# Patient Record
Sex: Male | Born: 1955 | Race: White | Hispanic: No | Marital: Married | Smoking: Former smoker
Health system: Southern US, Community
[De-identification: ages and names within clinical notes are randomized; demographics above are authoritative.]

## PROBLEM LIST (undated history)

## (undated) DIAGNOSIS — I10 Essential (primary) hypertension: Secondary | ICD-10-CM

## (undated) DIAGNOSIS — E785 Hyperlipidemia, unspecified: Secondary | ICD-10-CM

## (undated) DIAGNOSIS — I2510 Atherosclerotic heart disease of native coronary artery without angina pectoris: Secondary | ICD-10-CM

## (undated) DIAGNOSIS — I219 Acute myocardial infarction, unspecified: Secondary | ICD-10-CM

## (undated) DIAGNOSIS — E119 Type 2 diabetes mellitus without complications: Secondary | ICD-10-CM

## (undated) DIAGNOSIS — G4733 Obstructive sleep apnea (adult) (pediatric): Secondary | ICD-10-CM

## (undated) DIAGNOSIS — I5189 Other ill-defined heart diseases: Secondary | ICD-10-CM

## (undated) DIAGNOSIS — Z9889 Other specified postprocedural states: Secondary | ICD-10-CM

## (undated) DIAGNOSIS — Z8679 Personal history of other diseases of the circulatory system: Secondary | ICD-10-CM

## (undated) HISTORY — DX: Obstructive sleep apnea (adult) (pediatric): G47.33

## (undated) HISTORY — DX: Nausea with vomiting, unspecified: Z98.890

## (undated) HISTORY — DX: Atherosclerotic heart disease of native coronary artery without angina pectoris: I25.10

## (undated) HISTORY — DX: Other ill-defined heart diseases: I51.89

## (undated) HISTORY — DX: Hyperlipidemia, unspecified: E78.5

## (undated) HISTORY — DX: Personal history of other diseases of the circulatory system: Z86.79

## (undated) HISTORY — PX: TONSILLECTOMY: SUR1361

## (undated) HISTORY — PX: CARDIAC CATHETERIZATION: SHX172

## (undated) HISTORY — PX: CORONARY ARTERY BYPASS GRAFT: SHX141

## (undated) HISTORY — DX: Type 2 diabetes mellitus without complications: E11.9

---

## 2002-07-28 HISTORY — PX: ROTATOR CUFF REPAIR: SHX139

## 2004-03-01 HISTORY — PX: TRIGGER FINGER RELEASE: SHX641

## 2004-05-27 ENCOUNTER — Encounter: Payer: Self-pay | Admitting: Unknown Physician Specialty

## 2004-05-28 ENCOUNTER — Encounter: Payer: Self-pay | Admitting: Unknown Physician Specialty

## 2005-06-27 ENCOUNTER — Ambulatory Visit: Payer: Self-pay | Admitting: Unknown Physician Specialty

## 2006-11-18 ENCOUNTER — Ambulatory Visit: Payer: Self-pay | Admitting: Internal Medicine

## 2007-04-16 ENCOUNTER — Ambulatory Visit: Payer: Self-pay | Admitting: Emergency Medicine

## 2008-11-07 IMAGING — US SCREENING ULTRASOUND OF ABDOMINAL AORTA
1 series · 10 of 10 positions shown · non-contrast
Comparison: none

REASON FOR EXAM: pulsating mass in abd area eval for AAA
COMMENTS:

[Series 1: screening ultrasound of abdominal aorta · 10 of 10 slices shown]
[im 1/10]
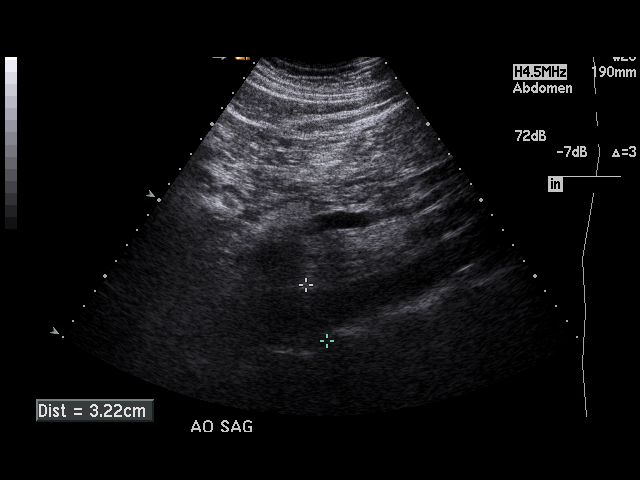
[im 2/10]
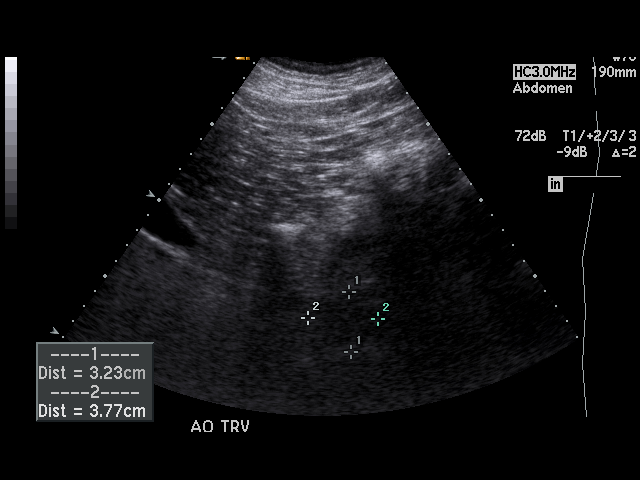
[im 3/10]
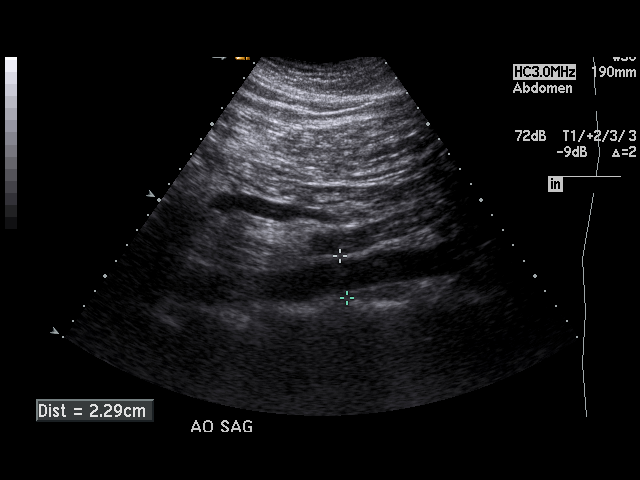
[im 4/10]
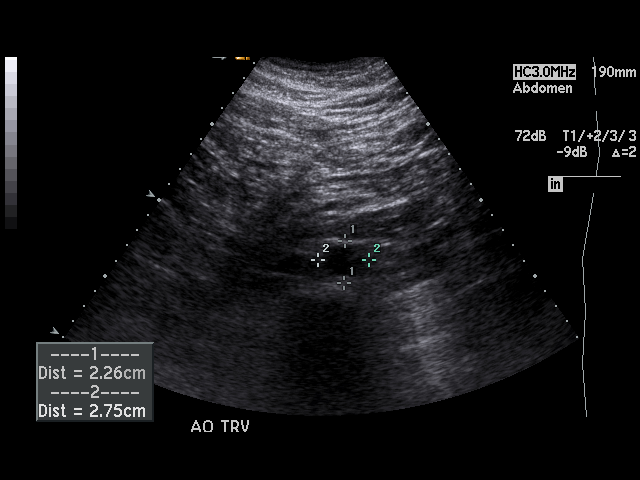
[im 5/10]
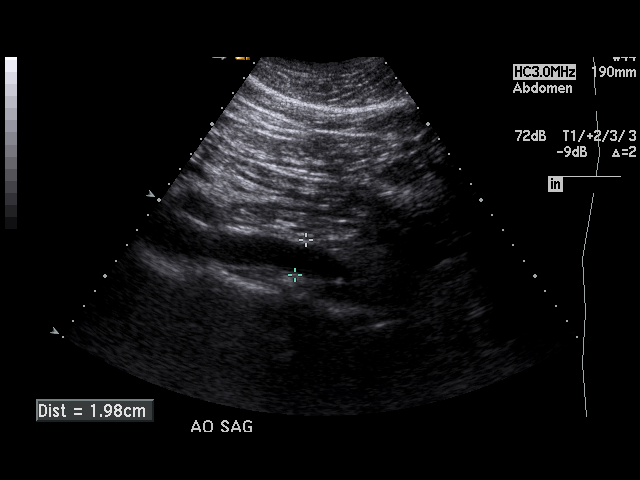
[im 6/10]
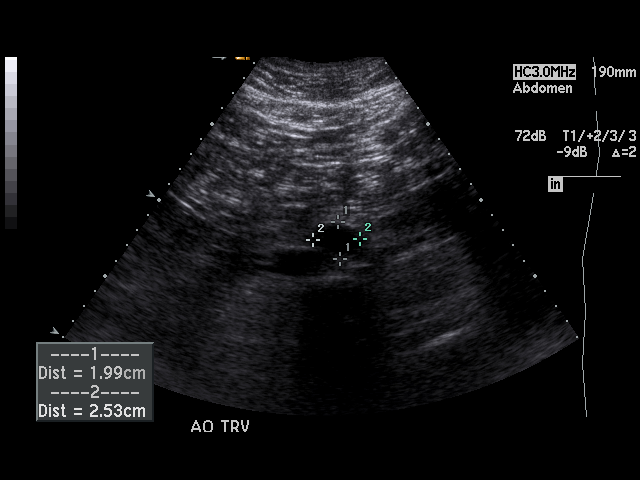
[im 7/10]
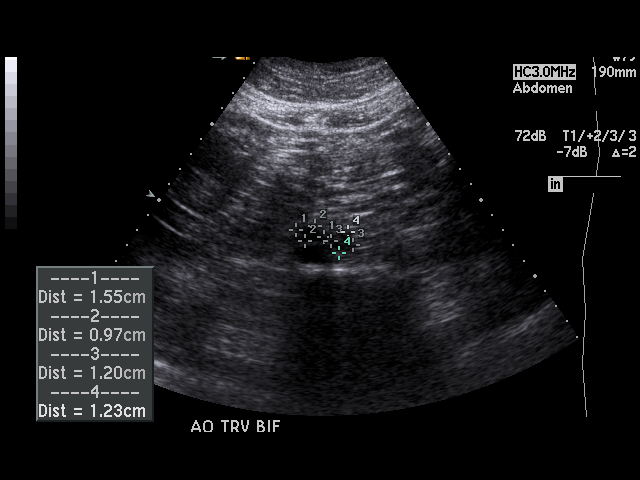
[im 8/10]
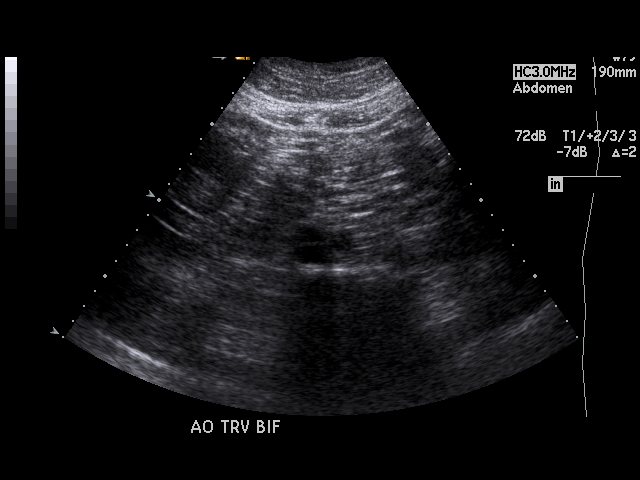
[im 9/10]
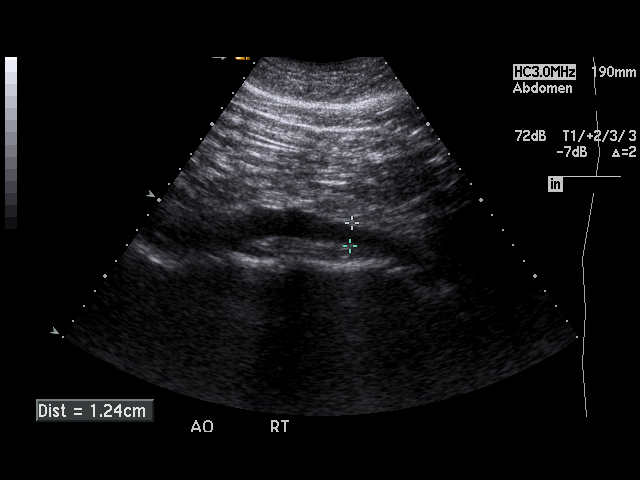
[im 10/10]
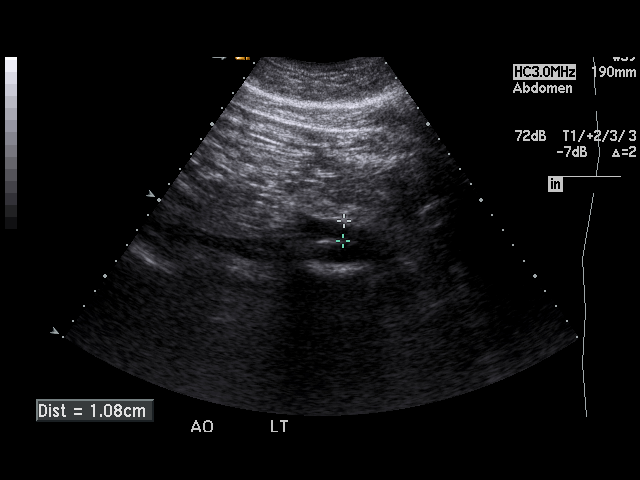

[10 of 10 positions shown; findings below may reference images not displayed]

PROCEDURE:     US  - US EXAM AAA SCREENING  - November 18, 2006  [DATE]

RESULT:     Abdominal aortic aneurysm screening evaluation demonstrates AP
dimension of up to 3.2 cm with a transverse dimension of 3.7 cm. This does
appear to taper distally. The iliac arteries measure 1.24 cm on the RIGHT
and 1.08 cm on the LEFT.
IMPRESSION: Maximal AP dimension of the abdominal aorta measures 3.2 cm with a
transverse diameter 3.7 cm.

## 2011-05-26 ENCOUNTER — Ambulatory Visit: Payer: Self-pay | Admitting: Unknown Physician Specialty

## 2011-05-29 ENCOUNTER — Ambulatory Visit: Payer: Self-pay | Admitting: Unknown Physician Specialty

## 2011-06-11 ENCOUNTER — Ambulatory Visit: Payer: Self-pay | Admitting: Unknown Physician Specialty

## 2011-06-28 ENCOUNTER — Ambulatory Visit: Payer: Self-pay | Admitting: Unknown Physician Specialty

## 2012-03-29 ENCOUNTER — Ambulatory Visit: Payer: Self-pay | Admitting: Medical

## 2012-03-29 LAB — RAPID STREP-A WITH REFLX: Micro Text Report: NEGATIVE

## 2012-03-31 LAB — BETA STREP CULTURE(ARMC)

## 2012-10-21 DIAGNOSIS — I219 Acute myocardial infarction, unspecified: Secondary | ICD-10-CM

## 2012-10-21 HISTORY — DX: Acute myocardial infarction, unspecified: I21.9

## 2012-10-28 ENCOUNTER — Encounter: Payer: Self-pay | Admitting: *Deleted

## 2012-10-28 ENCOUNTER — Encounter: Payer: Self-pay | Admitting: Cardiovascular Disease

## 2012-10-28 ENCOUNTER — Ambulatory Visit (INDEPENDENT_AMBULATORY_CARE_PROVIDER_SITE_OTHER): Payer: BC Managed Care – PPO | Admitting: Cardiovascular Disease

## 2012-10-28 VITALS — BP 110/76 | HR 61 | Ht 70.0 in | Wt 238.0 lb

## 2012-10-28 DIAGNOSIS — E119 Type 2 diabetes mellitus without complications: Secondary | ICD-10-CM

## 2012-10-28 DIAGNOSIS — E785 Hyperlipidemia, unspecified: Secondary | ICD-10-CM

## 2012-10-28 DIAGNOSIS — I2510 Atherosclerotic heart disease of native coronary artery without angina pectoris: Secondary | ICD-10-CM

## 2012-10-28 NOTE — Progress Notes (Signed)
History of Present Illness: 57 yo male with history of CAD with MI March 2014, HLD, DM, OSA who is here today to establish cardiac care. He was in [place], [place] on 10/21/12 when he suffered an anterior wall MI. Emergent cardiac cath with 99% mid LAD stenosis, Diagonal 2 with 50% ostial stenosis, 50% mid Circumflex stenosis, 1st OM ostial 40% stenosis, 50% mid RCA stenosis. Three overlapping DES were placed in the mid LAD (2.75 x 32 mm Promus Premier, 2.5 x 24 mm Promus Premier, 2.25 x 12 mm Promus Premier). LVEF 35% by echo. He was treated with ASA and Effient as well as beta blocker, Ace-inh and statin.   He is doing well. No chest pain or SOB. He wants to go back to work. Tolerating meds.   Primary Care Physician: Dorothey Baseman  Past Medical History  Diagnosis Date  . CAD (coronary artery disease)     Anterior STEMI 10/21/12, treated with PCI of LAD, 3 overlapping DES mid LAD  . DM (diabetes mellitus)     Diagnosed 2012  . Hyperlipidemia   . Obstructive sleep apnea     Past Surgical History  Procedure Laterality Date  . Rotator cuff repair  2004  . Tonsillectomy      Current Outpatient Prescriptions  Medication Sig Dispense Refill  . aspirin 81 MG tablet Take 81 mg by mouth daily.      Marland Kitchen atorvastatin (LIPITOR) 80 MG tablet TAKE ONE TABLET DAILY      . carvedilol (COREG) 6.25 MG tablet TAKE ONE TABLET DAILY      . lisinopril (PRINIVIL,ZESTRIL) 2.5 MG tablet TAKE ONE TABLET TWICE DAILY      . metFORMIN (GLUCOPHAGE) 1000 MG tablet TAKE ONE TABLET TWICE DAILY      . Multiple Vitamins-Minerals (CENTRUM SILVER ADULT 50+ PO) TAKE ONE TABLET DAILY      . NITROSTAT 0.4 MG SL tablet prn      . Omega-3 Fatty Acids (OMEGA-3 FISH OIL PO) Take by mouth. TAKE ONE CAPSULE DAILY      . sitaGLIPtin (JANUVIA) 100 MG tablet Take 100 mg by mouth daily.       No current facility-administered medications for this visit.    No Known Allergies  History   Social History  . Marital Status:  Married    Spouse Name: N/A    Number of Children: 2  . Years of Education: N/A   Occupational History  . Therapist, music    Social History Main Topics  . Smoking status: Never Smoker   . Smokeless tobacco: Not on file  . Alcohol Use: No  . Drug Use: No  . Sexually Active: Not on file   Other Topics Concern  . Not on file   Social History Narrative  . No narrative on file    Family History  Problem Relation Age of Onset  . Heart attack Father   . Stroke Mother   . Stroke Brother     Review of Systems:  As stated in the HPI and otherwise negative.   BP 110/76  Pulse 61  Ht 5\' 10"  (1.778 m)  Wt 238 lb (107.956 kg)  BMI 34.15 kg/m2  SpO2 98%  Physical Examination: General: Well developed, well nourished, NAD HEENT: OP clear, mucus membranes moist SKIN: warm, dry. No rashes. Neuro: No focal deficits Musculoskeletal: Muscle strength 5/5 all ext Psychiatric: Mood and affect normal Neck: No JVD, no carotid bruits, no thyromegaly, no lymphadenopathy. Lungs:Clear bilaterally, no wheezes,  rhonci, crackles Cardiovascular: Regular rate and rhythm. No murmurs, gallops or rubs. Abdomen:Soft. Bowel sounds present. Non-tender.  Extremities: No lower extremity edema. Pulses are 2 + in the bilateral DP/PT.  EKG: NSR, rate 61 bpm. Poor R wave progression precordial leads. T wave inversion lateral leads.   Assessment and Plan:   1. CAD: Recent MI 10/21/12 in [place]. 3 overlapping DES in mid LAD. Moderate disease in RCA and Circumflex. Doing well over last week. Will continue ASA, Effient, beta blocker, statin, Ace-inh. He will need repeat echo in 3 months to assess LVEF. Referral to cardiac rehab at [facility]. Return to work next week.   2. Ischemic cardiomyopathy: Volume status ok. He is on good medical therapy. Repeat echo in three months as above.   3. HLD: Continue statin.

## 2012-10-28 NOTE — Patient Instructions (Addendum)
Your physician recommends that you schedule a follow-up appointment in:  Early July 2014. --few days after echocardiogram  Your physician has requested that you have an echocardiogram. Echocardiography is a painless test that uses sound waves to create images of your heart. It provides your doctor with information about the size and shape of your heart and how well your heart's chambers and valves are working. This procedure takes approximately one hour. There are no restrictions for this procedure. To be done in early July   We will make referral for you to attend Cardiac Rehab at [facility]

## 2012-11-18 ENCOUNTER — Ambulatory Visit: Payer: Self-pay | Admitting: Family Medicine

## 2013-01-11 ENCOUNTER — Ambulatory Visit: Payer: Self-pay | Admitting: Family Medicine

## 2013-01-27 ENCOUNTER — Ambulatory Visit (HOSPITAL_COMMUNITY): Payer: BC Managed Care – PPO | Attending: Cardiovascular Disease

## 2013-01-27 ENCOUNTER — Other Ambulatory Visit (HOSPITAL_COMMUNITY): Payer: BC Managed Care – PPO

## 2013-01-27 DIAGNOSIS — E119 Type 2 diabetes mellitus without complications: Secondary | ICD-10-CM | POA: Insufficient documentation

## 2013-01-27 DIAGNOSIS — E785 Hyperlipidemia, unspecified: Secondary | ICD-10-CM | POA: Insufficient documentation

## 2013-01-27 DIAGNOSIS — I2589 Other forms of chronic ischemic heart disease: Secondary | ICD-10-CM | POA: Insufficient documentation

## 2013-01-27 DIAGNOSIS — I2510 Atherosclerotic heart disease of native coronary artery without angina pectoris: Secondary | ICD-10-CM

## 2013-01-27 NOTE — Progress Notes (Signed)
Echocardiogram performed.  

## 2013-02-23 ENCOUNTER — Encounter: Payer: Self-pay | Admitting: Cardiovascular Disease

## 2013-02-23 ENCOUNTER — Ambulatory Visit (INDEPENDENT_AMBULATORY_CARE_PROVIDER_SITE_OTHER): Payer: BC Managed Care – PPO | Admitting: Cardiovascular Disease

## 2013-02-23 VITALS — BP 112/62 | HR 60 | Ht 70.0 in | Wt 225.0 lb

## 2013-02-23 DIAGNOSIS — I2510 Atherosclerotic heart disease of native coronary artery without angina pectoris: Secondary | ICD-10-CM

## 2013-02-23 NOTE — Progress Notes (Signed)
History of Present Illness: 57 yo male with history of CAD with MI March 2014, HLD, DM, OSA who is here today for cardiology follow up. He was in [place], [place] on 10/21/12 when he suffered an anterior wall MI. Emergent cardiac cath with 99% mid LAD stenosis, Diagonal 2 with 50% ostial stenosis, 50% mid Circumflex stenosis, 1st OM ostial 40% stenosis, 50% mid RCA stenosis. Three overlapping DES were placed in the mid LAD (2.75 x 32 mm Promus Premier, 2.5 x 24 mm Promus Premier, 2.25 x 12 mm Promus Premier). LVEF 35% by echo. He was treated with ASA and Effient as well as beta blocker, Ace-inh and statin. He is now wearing CPAP at night. Echo 01/28/13 with LVEF 60-65%.   He is doing well. No chest pain or SOB. Tolerating meds. He walks on the treadmill every day.   Primary Care Physician: Dorothey Baseman  Last Lipid Profile: Followed in primary care.   Past Medical History  Diagnosis Date  . CAD (coronary artery disease)     Anterior STEMI 10/21/12, treated with PCI of LAD, 3 overlapping DES mid LAD  . DM (diabetes mellitus)     Diagnosed 2012  . Hyperlipidemia   . Obstructive sleep apnea     Past Surgical History  Procedure Laterality Date  . Rotator cuff repair  2004  . Tonsillectomy      Current Outpatient Prescriptions  Medication Sig Dispense Refill  . aspirin 81 MG tablet Take 81 mg by mouth daily.      Marland Kitchen atorvastatin (LIPITOR) 80 MG tablet TAKE ONE TABLET DAILY      . carvedilol (COREG) 6.25 MG tablet TAKE ONE TABLET DAILY      . lisinopril (PRINIVIL,ZESTRIL) 2.5 MG tablet TAKE ONE TABLET TWICE DAILY      . metFORMIN (GLUCOPHAGE) 1000 MG tablet TAKE ONE TABLET TWICE DAILY      . Multiple Vitamins-Minerals (CENTRUM SILVER ADULT 50+ PO) TAKE ONE TABLET DAILY      . NITROSTAT 0.4 MG SL tablet prn      . Omega-3 Fatty Acids (OMEGA-3 FISH OIL PO) Take by mouth. TAKE ONE CAPSULE DAILY      . sitaGLIPtin (JANUVIA) 100 MG tablet Take 100 mg by mouth daily.       No current  facility-administered medications for this visit.    No Known Allergies  History   Social History  . Marital Status: Married    Spouse Name: N/A    Number of Children: 2  . Years of Education: N/A   Occupational History  . Therapist, music    Social History Main Topics  . Smoking status: Never Smoker   . Smokeless tobacco: Not on file  . Alcohol Use: No  . Drug Use: No  . Sexually Active: Not on file   Other Topics Concern  . Not on file   Social History Narrative  . No narrative on file    Family History  Problem Relation Age of Onset  . Heart attack Father   . Stroke Mother   . Stroke Brother     Review of Systems:  As stated in the HPI and otherwise negative.   BP 112/62  Pulse 60  Ht 5\' 10"  (1.778 m)  Wt 225 lb (102.059 kg)  BMI 32.28 kg/m2  SpO2 94%  Physical Examination: General: Well developed, well nourished, NAD HEENT: OP clear, mucus membranes moist SKIN: warm, dry. No rashes. Neuro: No focal deficits Musculoskeletal: Muscle strength 5/5  all ext Psychiatric: Mood and affect normal Neck: No JVD, no carotid bruits, no thyromegaly, no lymphadenopathy. Lungs:Clear bilaterally, no wheezes, rhonci, crackles Cardiovascular: Regular rate and rhythm. No murmurs, gallops or rubs. Abdomen:Soft. Bowel sounds present. Non-tender.  Extremities: No lower extremity edema. Pulses are 2 + in the bilateral DP/PT.  Echo 01/27/13: Left ventricle: The cavity size was normal. Wall thickness was increased in a pattern of mild LVH. Systolic function was normal. The estimated ejection fraction was in the range of 60% to 65%. Hypokinesis of the entire myocardium. Left ventricular diastolic function parameters were normal. - Atrial septum: No defect or patent foramen ovale was identified.  Assessment and Plan:   1. CAD: MI 10/21/12 in [place]. 3 overlapping DES in mid LAD. Moderate disease in RCA and Circumflex. Will continue ASA, Effient, beta blocker,  statin, Ace-inh.    2. Ischemic cardiomyopathy: LVEF normal by echo 01/28/13.  Continue meds.   3. HLD: Continue statin.

## 2013-02-23 NOTE — Patient Instructions (Addendum)
Your physician wants you to follow-up in:  6 months. You will receive a reminder letter in the mail two months in advance. If you don't receive a letter, please call our office to schedule the follow-up appointment.   

## 2013-06-02 ENCOUNTER — Other Ambulatory Visit: Payer: Self-pay

## 2013-08-23 ENCOUNTER — Observation Stay: Payer: Self-pay | Admitting: Internal Medicine

## 2013-08-23 DIAGNOSIS — I200 Unstable angina: Secondary | ICD-10-CM

## 2013-08-23 DIAGNOSIS — I498 Other specified cardiac arrhythmias: Secondary | ICD-10-CM

## 2013-08-23 LAB — BASIC METABOLIC PANEL
Anion Gap: 4 — ABNORMAL LOW (ref 7–16)
BUN: 15 mg/dL (ref 7–18)
Calcium, Total: 9.2 mg/dL (ref 8.5–10.1)
Chloride: 105 mmol/L (ref 98–107)
Co2: 28 mmol/L (ref 21–32)
Creatinine: 0.77 mg/dL (ref 0.60–1.30)
EGFR (African American): >60
EGFR (Non-African Amer.): >60
Glucose: 122 mg/dL — ABNORMAL HIGH (ref 65–99)
Osmolality: 276 (ref 275–301)
Potassium: 4.3 mmol/L (ref 3.5–5.1)
Sodium: 137 mmol/L (ref 136–145)

## 2013-08-23 LAB — CBC
HCT: 41.1 % (ref 40.0–52.0)
HGB: 14 g/dL (ref 13.0–18.0)
MCH: 30.5 pg (ref 26.0–34.0)
MCHC: 34 g/dL (ref 32.0–36.0)
MCV: 90 fL (ref 80–100)
Platelet: 201 10*3/uL (ref 150–440)
RBC: 4.57 10*6/uL (ref 4.40–5.90)
RDW: 13 % (ref 11.5–14.5)
WBC: 9 10*3/uL (ref 3.8–10.6)

## 2013-08-23 LAB — TROPONIN I
Troponin-I: <0.02 ng/mL
Troponin-I: <0.02 ng/mL
Troponin-I: <0.02 ng/mL

## 2013-09-09 ENCOUNTER — Encounter: Payer: Self-pay | Admitting: Cardiovascular Disease

## 2013-09-09 ENCOUNTER — Ambulatory Visit (INDEPENDENT_AMBULATORY_CARE_PROVIDER_SITE_OTHER): Payer: BC Managed Care – PPO | Admitting: Cardiovascular Disease

## 2013-09-09 VITALS — BP 106/56 | HR 54 | Ht 70.0 in

## 2013-09-09 DIAGNOSIS — E785 Hyperlipidemia, unspecified: Secondary | ICD-10-CM

## 2013-09-09 DIAGNOSIS — I2510 Atherosclerotic heart disease of native coronary artery without angina pectoris: Secondary | ICD-10-CM

## 2013-09-09 MED ORDER — CARVEDILOL 3.125 MG PO TABS: 3.1250 mg | ORAL_TABLET | Freq: Two times a day (BID) | ORAL | Status: DC

## 2013-09-09 MED ORDER — CARVEDILOL 3.125 MG PO TABS: 3.1250 mg | ORAL_TABLET | Freq: Two times a day (BID) | ORAL | Status: AC

## 2013-09-09 NOTE — Progress Notes (Signed)
History of Present Illness: 58 yo male with history of CAD with MI March 2014, HLD, DM, OSA who is here today for cardiology follow up. He was in [place], [place] on 10/21/12 when he suffered an anterior wall MI. Emergent cardiac cath with 99% mid LAD stenosis, Diagonal 2 with 50% ostial stenosis, 50% mid Circumflex stenosis, 1st OM ostial 40% stenosis, 50% mid RCA stenosis. Three overlapping DES were placed in the mid LAD (2.75 x 32 mm Promus Premier, 2.5 x 24 mm Promus Premier, 2.25 x 12 mm Promus Premier). LVEF 35% by echo. He was treated with ASA and Effient as well as beta blocker, Ace-inh and statin. He is now wearing CPAP at night. Echo 01/28/13 with LVEF 60-65%. Admitted to [facility] 2 weeks ago with sharp chest pain. Cath per Dr. Ellyn Hack February 2015 with stable disease. Patent stents LAD.   He is doing well. No chest pain or SOB. Tolerating meds. He walks on the treadmill every day.   Primary Care Physician: Juluis Pitch  Last Lipid Profile: Followed in primary care.   Past Medical History  Diagnosis Date  . CAD (coronary artery disease)     Anterior STEMI 10/21/12, treated with PCI of LAD, 3 overlapping DES mid LAD  . DM (diabetes mellitus)     Diagnosed 2012  . Hyperlipidemia   . Obstructive sleep apnea     Past Surgical History  Procedure Laterality Date  . Rotator cuff repair  2004  . Tonsillectomy      Current Outpatient Prescriptions  Medication Sig Dispense Refill  . aspirin 81 MG tablet Take 81 mg by mouth daily.      Marland Kitchen atorvastatin (LIPITOR) 80 MG tablet TAKE ONE TABLET DAILY      . carvedilol (COREG) 6.25 MG tablet TAKE ONE TABLET DAILY      . JANUMET XR 50-1000 MG TB24 1 tab twice a day      . lisinopril (PRINIVIL,ZESTRIL) 2.5 MG tablet TAKE ONE TABLET TWICE DAILY      . Multiple Vitamins-Minerals (CENTRUM SILVER ADULT 50+ PO) TAKE ONE TABLET DAILY      . NITROSTAT 0.4 MG SL tablet prn      . Omega-3 Fatty Acids (OMEGA-3 FISH OIL PO) Take by mouth. TAKE ONE  CAPSULE DAILY      . prasugrel (EFFIENT) 10 MG TABS Take 10 mg by mouth daily.      . sitaGLIPtin (JANUVIA) 100 MG tablet Take 100 mg by mouth daily.       No current facility-administered medications for this visit.    No Known Allergies  History   Social History  . Marital Status: Married    Spouse Name: N/A    Number of Children: 2  . Years of Education: N/A   Occupational History  . Engineer, mining    Social History Main Topics  . Smoking status: Never Smoker   . Smokeless tobacco: Not on file  . Alcohol Use: No  . Drug Use: No  . Sexual Activity: Not on file   Other Topics Concern  . Not on file   Social History Narrative  . No narrative on file    Family History  Problem Relation Age of Onset  . Heart attack Father   . Stroke Mother   . Stroke Brother     Review of Systems:  As stated in the HPI and otherwise negative.   BP 106/56  Pulse 54  Ht 5\' 10"  (1.778 m)  Physical Examination: General: Well developed, well nourished, NAD HEENT: OP clear, mucus membranes moist SKIN: warm, dry. No rashes. Neuro: No focal deficits Musculoskeletal: Muscle strength 5/5 all ext Psychiatric: Mood and affect normal Neck: No JVD, no carotid bruits, no thyromegaly, no lymphadenopathy. Lungs:Clear bilaterally, no wheezes, rhonci, crackles Cardiovascular: Regular rate and rhythm. No murmurs, gallops or rubs. Abdomen:Soft. Bowel sounds present. Non-tender.  Extremities: No lower extremity edema. Pulses are 2 + in the bilateral DP/PT.  Echo 01/27/13: Left ventricle: The cavity size was normal. Wall thickness was increased in a pattern of mild LVH. Systolic function was normal. The estimated ejection fraction was in the range of 60% to 65%. Hypokinesis of the entire myocardium. Left ventricular diastolic function parameters were normal. - Atrial septum: No defect or patent foramen ovale was Identified.  EKG: Sinus brady, rate 54 bpm. Old septal infarct.    Assessment and Plan:   1. CAD: MI 10/21/12 in [place]. 3 overlapping DES in mid LAD. Moderate disease in RCA and Circumflex. Stable by cath February 2015 at [facility] per Dr. Ellyn Hack with stable disease. Will continue ASA, Effient, beta blocker, statin, Ace-inh.  Will change to Plavix at next visit.   2. Ischemic cardiomyopathy: LVEF normal by echo 01/28/13.  Continue meds.   3. HLD: Continue statin.

## 2013-09-09 NOTE — Patient Instructions (Signed)
Your physician wants you to follow-up in: 6 months. You will receive a reminder letter in the mail two months in advance. If you don't receive a letter, please call our office to schedule the follow-up appointment.  Your physician has recommended you make the following change in your medication:  Take Coreg 3.125 mg by mouth twice daily

## 2014-11-18 NOTE — Consult Note (Signed)
 General Aspect [place] is 59 yo male, pt of Dr. McAlhany's, w/ PMHx s/f CAD, h/o ICM, HLD, DM2, OSA on CPAP and obesity who was admitted to [facility] today for unstable angina. Cardiology consulted for the same.   He was in [place], [place] on 10/21/12 when he suffered an anterior wall MI. Emergent cardiac cath with 99% mid LAD stenosis, Diagonal 2 with 50% ostial stenosis, 50% mid Circumflex stenosis, 1st OM ostial 40% stenosis, 50% mid RCA stenosis. Three overlapping DES were placed in the mid LAD (2.75 x 32 mm Promus Premier, 2.5 x 24 mm Promus Premier, 2.25 x 12 mm Promus Premier). LVEF 35% by echo. He was treated with ASA and Effient as well as beta blocker, Ace-inh and statin. He is now wearing CPAP at night. Echo 01/28/13 with LVEF 60-65%. He followed up w/ Dr. McAlhany 01/2013, noted to be stable.  He has been doing very well since- walks 30 min 2-3x/week w/o limitation. Hgb A1C < 6.0%, cholesterol improved on physicial last fall. Continues to take all cardiac meds (including Effient) as prescribed. He had flu-like symptoms two weeks ago- congestion, cough. He had a coughing spell last night as well.   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 Thinking this was a muscle cramp, he rested and stretched with some improvement. Upon working again, the pain recurred and lasted longer before remitting. Denies associated dyspnea, diaphoresis or nausea. Different in quality from prior MI. Given his cardiac history, and the fact that his wife is a nurse, he presented to [facility] ED for further evaluation.   Present Illness In the ED, EKG reveals anterior Qs (old), no new ST/T changes. Initial TnI WNL. BMP and CBC WNL. CXR- no active cardiopulmonary disease. He did receive NTG SL x 1 w/ complete relief on arrival. Received a full-dose ASA as well. Medicine team admitting.   PAST MEDICAL HISTORY CAD (anterior STEMI  09/2012 s/p DES x 3-mid LAD) Ischemic cardiomyopathy (most recent EF 60-65%) HLD OSA  PAST SURGICAL HISTORY PTCA Rotator cuff repair Tonsillectomy  ALLERGIES No known drug allergies  SOCIAL HISTORY Denies tobacco, EtOH or illicit drug use. Lives in [place], [place]. Married, 2 children.  FAMILY HISTORY Heart attack- father Stroke- mother Stroke- brother   Physical Exam:  GEN no acute distress, obese   HEENT pink conjunctivae, PERRL, hearing intact to voice   NECK supple  No masses  trachea midline  no JVD or bruits   RESP normal resp effort  clear BS  no use of accessory muscles   CARD Bradycardic  Normal, S1, S2  No murmur   ABD denies tenderness  soft  normal BS   EXTR negative cyanosis/clubbing, negative edema, +2 femoral pulses, no femoral bruits, +2 radial pulses, equal bilaterally   SKIN normal to palpation   NEURO follows commands, motor/sensory function intact   PSYCH alert, A+O to time, place, person   Review of Systems:  Subjective/Chief Complaint chest pain   General: No Complaints   Respiratory: No Complaints   Cardiovascular: Chest pain or discomfort  no PND, orthopnea, LE edema, weight gain   Home Medications: Medication Instructions Status  aspirin 81 mg oral tablet 1 tab(s) orally once a day Active  omega-3 polyunsaturated fatty acids - oral capsule 1 cap(s) orally once a day Active  lisinopril 2.5 mg oral tablet 1 tab(s) orally once a day Active    Janumet 1000 mg-50 mg oral tablet 1 tab(s) orally 2 times a day Active  atorvastatin 80 mg oral tablet 1 tab(s) orally once a day (at bedtime) Active  carvedilol 6.25 mg oral tablet 0.5 tab(s) orally 2 times a day Active  Effient 10 mg oral tablet 1 tab(s) orally once a day Active  multivitamin 1 cap(s) orally once a day Active   Lab Results:  Routine Chem:  27-Jan-15 10:55   Glucose, Serum  122  BUN 15  Creatinine (comp) 0.77  Sodium, Serum 137  Potassium, Serum 4.3  Chloride, Serum 105   CO2, Serum 28  Calcium (Total), Serum 9.2  Anion Gap  4  Osmolality (calc) 276  eGFR (African American) >60  eGFR (Non-African American) >60 (eGFR values <42m/min/1.73 m2 may be an indication of chronic kidney disease (CKD). Calculated eGFR is useful in patients with stable renal function. The eGFR calculation will not be reliable in acutely ill patients when serum creatinine is changing rapidly. It is not useful in  patients on dialysis. The eGFR calculation may not be applicable to patients at the low and high extremes of body sizes, pregnant women, and vegetarians.)  Cardiac:  27-Jan-15 10:55   Troponin I < 0.02 (0.00-0.05 0.05 ng/mL or less: NEGATIVE  Repeat testing in 3-6 hrs  if clinically indicated. >0.05 ng/mL: POTENTIAL  MYOCARDIAL INJURY. Repeat  testing in 3-6 hrs if  clinically indicated. NOTE: An increase or decrease  of 30% or more on serial  testing suggests a  clinically important change)  Routine Hem:  27-Jan-15 10:55   WBC (CBC) 9.0  RBC (CBC) 4.57  Hemoglobin (CBC) 14.0  Hematocrit (CBC) 41.1  Platelet Count (CBC) 201 (Result(s) reported on 23 Aug 2013 at 11:12AM.)  MCV 90  MCH 30.5  MCHC 34.0  RDW 13.0   EKG:  Interpretation NSR, Q waves V1-V3, LAD, inferior IVCD, no ST/T changes   Rate 63   Radiology Results: XRay:    27-Jan-15 13:39, Chest PA and Lateral  Chest PA and Lateral   REASON FOR EXAM:    chest pain  COMMENTS:       PROCEDURE: DXR - DXR CHEST PA (OR AP) AND LATERAL  - Aug 23 2013  1:39PM     CLINICAL DATA:  Chest pain.    EXAM:  CHEST  2 VIEW    COMPARISON:  None.    FINDINGS:  Cardiopericardial silhouette within normal limits. Mediastinal  contours normal. Trachea midline. No airspace disease or effusion.   IMPRESSION:  No active cardiopulmonary disease.      Electronically Signed    By: GDereck LigasM.D.    On: 08/23/2013 14:04         Verified By: GMelvyn Novas M.D.,    No Known Allergies:     Impression 59yo male, pt of Dr. MCamillia Herter w/ PMHx s/f CAD, h/o ICM, HLD, DM2, OSA on CPAP who [facility]  1. CAD/Unstable angina The patient has a significant history of CAD. Anterior MI s/p DES x 3 in March 2014. Residual borderline disease elsewhere. He has done a good job of RF modification. He is fairly active, and denies exertional chest pain or dyspnea until this AM. The "sharp" quality is somewhat atypical. Radiation to his L shoulder could be musculoskeletal from sanding as well. It was relieved by NTG SL, had a stuttering quality and seemed to prolong in duration after resuming activity. EKG and initial TnI indicate  no evidence of ischemia/MI at present. Given significant cardiac history and RFs, favor diagnostic cardiac catheterization tomorrow AM.  -- Monitor on telemetry, cycle cardiac biomarkers -- Continue ASA, Effient, ACEi, BB, high-potency statin -- Nitrates +/- morphine PRN for residual chest pain; if recurs overnight and w/o relief, will need urgent cath -- Hold on heparin for now, add if rules in -- NPO past midnight, will confer further w/ MD  2. H/o ischemic cardiomyopathy EF returned to normal w/ revascularization and OMT on 01/2013 echo. Denies CHF-type symptoms. Euvolemic on exam.  -- Obtain LVgram on cath  3. Hyperlipidemia -- Check lipid panel -- Continue atorvastatin 80  4. OSA -- Continue CPAP while inpatient  5. Sinus bradycardia Mid 50s, baseline for patient. Asymptomatic.  -- Continue BB (benefit outweighs risk)   Electronic Signatures for Addendum Section:  Leonie Man (MD) (Signed Addendum 27-Jan-15 18:23)  I have seen & examined the patient this PM along with Mr. Roxy Cedar, PA-C.  I agree with his findings, History (I personaly reviewed his [facility] chart), exam & recommendations.  59 y/o man who is 10 months s/p Anterior STEMI with Cardiac arrest - PCI with 3 DES to LAD & near total recovery of cardiac  Fxn.  Had been stable & in [place] until this PM while working in the workshop -- developed L sided CP- shoulder.  Buy his description, the Sx was not totally likey his MI pain - not as severe & more of a sharp "twinge" than the intense pressure.  However, when the Sx did not dissipate with resting, his RN wife instructed him to call EMS --ER.  CP was relieved by NTG in the ER.  Thankfullly Troponin x 2 negative & ECG without notable ischemic changes.  I had a long talk with the pt & family re: options --> 1) r/o MI & if negtive do OP Muoview, 2) R/O MI & Inpt Myoview, 3) R/O MI - inpt cath.  While his Sx are somewhat atypical, I am concerned with the relief by NTG & being brought on by minimal exertion.  He has 3 overlapping stents - making In-stent-restenosis / thrombosis more likely, despite ASA/Effient. HE & his family are concerned that a Myoview would not be enough of a definitive answer.  With this concern, & the timing of Sx, 3 + stents & NTG relief, I agree that the most effective plan would be to plan [facility] +/- PCI tomorrow.  I have discussed the procedure & Risks/Benefits/Alternatives/Indications with the pt & family in detail.  He (they) agree with the plan.  Scheduled for ~1430 on 1/28.  I have made arrangements to be available @ that time.  Plan R Radial Approach.   Mallampati Airway #1.   Electronic Signatures: Meriel Pica (PA-C)  (Signed 27-Jan-15 14:50)  Authored: General Aspect/Present Illness, History and Physical Exam, Review of System, Home Medications, Labs, EKG , Radiology, Allergies, Impression/Plan Leonie Man (MD)  (Signed 27-Jan-15 18:23)  Co-Signer: General Aspect/Present Illness, History and Physical Exam, Review of System, Home Medications, Labs, Radiology, Allergies, Impression/Plan   Last Updated: 27-Jan-15 18:23 by Leonie Man (MD)

## 2014-11-18 NOTE — Discharge Summary (Signed)
PATIENT NAME:  Justin Mckinney, Justin Mckinney MR#:  [id number] DATE OF BIRTH:  [birth year]  DATE OF ADMISSION:  08/23/2013 DATE OF DISCHARGE:  08/24/2013  DISCHARGE DIAGNOSIS:  Chest pain, likely noncardiac with negative cardiac enzymes and negative cardiac catheterization, not having any more chest pain.   SECONDARY DIAGNOSES: 1.  Coronary artery disease.  2.  Diabetes. 3.  Hypertension.  4.  Hyperlipidemia.   CONSULTATIONS:  Cardiology, Dr. Glenetta Hew.   PROCEDURES AND RADIOLOGY:  Cardiac catheterization on 28th of Jan by Dr. Ellyn Hack showed mild to moderate coronary artery disease with patent stents, preserved EF with moderately increased LV and diastolic pressure.   Chest x-ray on the 27th of January showed no acute cardiopulmonary disease.   HISTORY AND SHORT HOSPITAL COURSE:  The patient is a 59 year old male with above-mentioned medical problems admitted for chest pain.  Please see Dr. Boykin Reaper dictated history and physical for further details.  The patient was ruled out with three negative sets of troponin.  Cardiology consultation was obtained with Dr. Ellyn Hack who recommended cardiac cath which was performed on 28th of January and did not show any significant coronary artery disease for any intervention.  The patient was chest pain-free and was discharged home on 28th of January in stable condition.   PHYSICAL EXAMINATION: VITAL SIGNS:  On the date of discharge, his vital signs are as follows:  Temperature 98.1, heart rate 55 per minute, respirations 14 per minute, blood pressure 133/78 mmHg.  He was saturating 98% on room.  Pertinent physical examination on the date of discharge:  CARDIOVASCULAR:  S1, S2 normal.  No murmurs, rubs, or gallop.  LUNGS:  Clear to auscultation bilaterally.  No wheezing, rales, rhonchi, or crepitation.  ABDOMEN:  Soft, benign.  NEUROLOGIC:  Nonfocal examination.  All other physical examination remained at baseline.   DISCHARGE MEDICATIONS: 1.  Aspirin 81 mg by  mouth daily.  2.  Omega-3 fatty acid 1 capsule by mouth daily.  3.  Lisinopril 2.5 mg by mouth daily.  4.  Atorvastatin 80 mg by mouth at bedtime.  5.  Effient 10 mg by mouth daily.  6.  Multivitamin once daily.  7.  Janumet 1000/50 1 tablet by mouth twice daily, to be held until the 29th of January and can be restarted on 30th of January considering cardiac cath performed today.   DISCHARGE DIET:  Low sodium, low fat, low cholesterol.   DISCHARGE ACTIVITY:  As tolerated.   DISCHARGE INSTRUCTIONS AND FOLLOW-UP:  The patient was instructed to stop his Coreg considering his heart rate being running low in the 50s.  He will need follow-up with his primary care physician, Dr. Juluis Pitch, in 1 to 2 weeks, and with cardiology, Dr. Fletcher Anon in 2 to 4 weeks.   Total time discharging this patient was 55 minutes.     ____________________________ Lucina Mellow. Manuella Ghazi, MD vss:ea D: 08/24/2013 23:11:19 ET T: 08/24/2013 23:59:59 ET JOB#: [id number]  cc: Shyquan Stallbaumer S. Manuella Ghazi, MD, <Dictator> Youlanda Roys. Lovie Macadamia, MD Mertie Clause Fletcher Anon, MD Leonie Man, MD Lucina Mellow [facility] MD ELECTRONICALLY SIGNED 08/27/2013 14:46

## 2014-11-18 NOTE — H&P (Signed)
PATIENT NAME:  LESLY, JOSLYN MR#:  [id number] DATE OF BIRTH:  1956/01/31  DATE OF ADMISSION:  08/23/2013  PRIMARY CARE PHYSICIAN: Dr. Lovie Macadamia.   CHIEF COMPLAINT: Left-sided chest pain.   HISTORY OF PRESENT ILLNESS: A 59 year old Caucasian male patient with a history of coronary artery disease with LAD stents placed in March 2015  , diabetes, hyperlipidemia, presents to the Emergency Room with acute onset of left-sided chest pain with uneasiness which started while he was doing some woodworking and exerting in his shop. Normally the patient walks, runs, goes to gym, does not have any pain. This is new. This lasted about two hours until he came to the Emergency Room, resolved with nitro pill.  This did wax and waning during that time. This did not have any aggravating or relieving factors except the nitroglycerin pill. Since March he has not had any chest pains. He did see [place] cardiology a few months back. Had an echocardiogram done, which was normal. He has been compliant with his aspirin, statin, beta blocker and Effient he has been taking.    Presently, he is chest pain free.    The patient  was on vacation in [place] when he had the MI and had the stent placed there.    PAST MEDICAL HISTORY: 1. Coronary artery disease with LAD stent in March 2014.  2. Diabetes mellitus type 2.  3. Hypertension.  4. Hyperlipidemia.  5. Remote tobacco abuse.   FAMILY HISTORY: Coronary artery disease in his parents.   SOCIAL HISTORY: The patient's smoked remotely in the past, quit in the 70s. Occasional alcohol. No illicit drug use.   CODE STATUS: FULL CODE.   ALLERGIES: No known drug allergies.   REVIEW OF SYSTEMS:  CONSTITUTIONAL: No fever, fatigue, weakness.  EYES: No blurry vision, pain.  ENT: No tinnitus, ear pain, or hearing loss. RESPIRATORY: No cough, wheeze, hemoptysis.  CARDIOVASCULAR: No chest pain.  GASTROINTESTINAL: No nausea, vomiting, diarrhea, abdominal pain.   GENITOURINARY: No dysuria, hematuria, frequency.  ENDOCRINE: No polyuria, nocturia, thyroid problems.  HEMATOLOGIC AND LYMPHATIC: No anemia, easy bruising, bleeding.  INTEGUMENTARY: No acne, rash, lesion.  MUSCULOSKELETAL: No back pain, arthritis.  NEUROLOGIC: No focal numbness, weakness, seizure.  PSYCHIATRIC: No anxiety or depression.   HOME MEDICATIONS: Include:  1. Effient 10 mg daily.  2. Aspirin 81 mg daily.  3. Atorvastatin 80 mg daily.  4. Coreg 3.125 mg 2 times a day.  5. Hanumt 50-1000mg   1 tablet oral 2 times a day.  6. Lisinopril 2.5 mg daily.  7. Multivitamin 1 tablet daily.  8. Omega-3 fatty acids oral daily.   PHYSICAL EXAMINATION: VITAL SIGNS: Temperature 98.1, pulse of 64, blood pressure 142/77, saturating 97% on room air.  GENERAL: Obese, Caucasian male patient lying in bed, seems comfortable, conversational, cooperative with exam.  PSYCHIATRIC: He is alert and oriented x 3. Mood and affect appropriate. Judgment intact.  HEENT: Atraumatic, normocephalic. Oral mucosa moist and pink. External ears and nose normal. No pallor. No icterus. Pupils bilaterally equal and reactive to light.  NECK: Supple. No thyromegaly. No palpable lymph nodes. Trachea midline. No carotid bruit, JVD.  HEART: S1 and S2 without any murmurs. Peripheral pulses 2+.   RESPIRATORY: Normal work of breathing. Clear to auscultation on both sides.  GASTROINTESTINAL: Soft abdomen, nontender. Bowel sounds present. No hepatosplenomegaly palpable.  GENITOURINARY: No CVA tenderness or bladder distention.  SKIN: Warm and dry. No petechiae, rash, ulcers.  MUSCULOSKELETAL: No joint swelling, redness, effusion of the large joints.  Normal muscle tone.  NEUROLOGICAL: Motor strength five out of five in upper and lower extremities.  LYMPHATIC: No cervical lymphadenopathy.   LABORATORY STUDIES: Show glucose 122, BUN 15, creatinine 0.77, sodium 137, potassium 4.3. Troponin less than 0.02. WBC 9, hemoglobin 14,  platelets of 201.   EKG shows normal sinus rhythm with poor R wave progression and Q waves in leads 2, lead 3. The patient did have LAD stent in the past.   Chest x-ray shows no acute abnormalities.   ASSESSMENT AND PLAN: 1. Atypical chest pain, which started with exertion and responded to nitroglycerin. We will admit the patient onto the telemetry floor, check two more sets of cardiac enzymes. I believe the patient will need cardiac catheterization. I discussed the case with Dr. Fletcher Anon, who will communicate with Dr. Ellyn Hack, who will see the patient. The patient does have some mild tenderness in the left shoulder area, but his pain today was in a different side, which has resolved at this time.  2. Hypertension continue the Coreg. Hold the lisinopril as the patient will receive contrast if he gets a cardiac catheter.  4. Diabetes. Continue the Januvia  but hold metformin.  5. Deep vein thrombosis prophylaxis with Lovenox.   CODE STATUS: FULL CODE.  TIME SPENT TODAY ON THIS CASE: Was 40 minutes.   ____________________________ Leia Alf Rudy Luhmann, MD srs:sg D: 08/23/2013 14:04:39 ET T: 08/23/2013 14:46:05 ET JOB#: [id number]  cc: Alveta Heimlich R. Lindbergh Winkles, MD, <Dictator> [place] M. Lovie Macadamia, MD  Neita Carp MD ELECTRONICALLY SIGNED 08/23/2013 15:19

## 2015-01-24 ENCOUNTER — Encounter: Payer: Self-pay | Admitting: Cardiovascular Disease

## 2015-01-24 ENCOUNTER — Ambulatory Visit (INDEPENDENT_AMBULATORY_CARE_PROVIDER_SITE_OTHER): Payer: BLUE CROSS/BLUE SHIELD | Admitting: Cardiovascular Disease

## 2015-01-24 VITALS — BP 110/80 | HR 53 | Ht 70.0 in | Wt 233.0 lb

## 2015-01-24 DIAGNOSIS — E785 Hyperlipidemia, unspecified: Secondary | ICD-10-CM

## 2015-01-24 DIAGNOSIS — I255 Ischemic cardiomyopathy: Secondary | ICD-10-CM

## 2015-01-24 DIAGNOSIS — I2510 Atherosclerotic heart disease of native coronary artery without angina pectoris: Secondary | ICD-10-CM

## 2015-01-24 MED ORDER — CLOPIDOGREL BISULFATE 75 MG PO TABS: 75.0000 mg | ORAL_TABLET | Freq: Every day | ORAL | Status: DC

## 2015-01-24 NOTE — Progress Notes (Signed)
Chief Complaint  Patient presents with  . Bleeding/Bruising     History of Present Illness: 59 yo male with history of CAD with MI March 2014, HLD, DM, OSA who is here today for cardiology follow up. He was in [place], [place] on 10/21/12 when he suffered an anterior wall MI. Emergent cardiac cath with 99% mid LAD stenosis, Diagonal 2 with 50% ostial stenosis, 50% mid Circumflex stenosis, 1st OM ostial 40% stenosis, 50% mid RCA stenosis. Three overlapping DES were placed in the mid LAD (2.75 x 32 mm Promus Premier, 2.5 x 24 mm Promus Premier, 2.25 x 12 mm Promus Premier). LVEF 35% by echo. He was treated with ASA and Effient as well as beta blocker, Ace-inh and statin. Echo 01/28/13 with LVEF 60-65%. Admitted to [facility] January 2015 with sharp chest pain. Cath per Dr. Ellyn Hack February 2015 with stable disease. Patent stents LAD.   He is doing well. No chest pain or SOB. Tolerating meds. He walks on the treadmill every day.   Primary Care Physician: Juluis Pitch  Last Lipid Profile: Followed in primary care.   Past Medical History  Diagnosis Date  . CAD (coronary artery disease)     Anterior STEMI 10/21/12, treated with PCI of LAD, 3 overlapping DES mid LAD  . DM (diabetes mellitus)     Diagnosed 2012  . Hyperlipidemia   . Obstructive sleep apnea     Past Surgical History  Procedure Laterality Date  . Rotator cuff repair  2004  . Tonsillectomy      Current Outpatient Prescriptions  Medication Sig Dispense Refill  . atorvastatin (LIPITOR) 80 MG tablet TAKE ONE TABLET DAILY    . carvedilol (COREG) 3.125 MG tablet Take 1 tablet (3.125 mg total) by mouth 2 (two) times daily. 180 tablet 3  . JANUMET XR 50-1000 MG TB24 1 tab twice a day    . lisinopril (PRINIVIL,ZESTRIL) 2.5 MG tablet TAKE ONE TABLET TWICE DAILY    . Multiple Vitamins-Minerals (CENTRUM SILVER ADULT 50+ PO) TAKE ONE TABLET DAILY    . NITROSTAT 0.4 MG SL tablet prn    . Omega-3 Fatty Acids (OMEGA-3 FISH OIL PO) Take by  mouth. TAKE ONE CAPSULE DAILY    . sitaGLIPtin (JANUVIA) 100 MG tablet Take 100 mg by mouth daily.    . clopidogrel (PLAVIX) 75 MG tablet Take 1 tablet (75 mg total) by mouth daily. 90 tablet 3   No current facility-administered medications for this visit.    No Known Allergies  History   Social History  . Marital Status: Married    Spouse Name: N/A  . Number of Children: 2  . Years of Education: N/A   Occupational History  . Engineer, mining    Social History Main Topics  . Smoking status: Never Smoker   . Smokeless tobacco: Not on file  . Alcohol Use: No  . Drug Use: No  . Sexual Activity: Not on file   Other Topics Concern  . Not on file   Social History Narrative    Family History  Problem Relation Age of Onset  . Heart attack Father   . Stroke Mother   . Stroke Brother     Review of Systems:  As stated in the HPI and otherwise negative.   BP 110/80 mmHg  Pulse 53  Ht 5\' 10"  (1.778 m)  Wt 233 lb (105.688 kg)  BMI 33.43 kg/m2  Physical Examination: General: Well developed, well nourished, NAD HEENT: OP clear, mucus membranes  moist SKIN: warm, dry. No rashes. Neuro: No focal deficits Musculoskeletal: Muscle strength 5/5 all ext Psychiatric: Mood and affect normal Neck: No JVD, no carotid bruits, no thyromegaly, no lymphadenopathy. Lungs:Clear bilaterally, no wheezes, rhonci, crackles Cardiovascular: Regular rate and rhythm. No murmurs, gallops or rubs. Abdomen:Soft. Bowel sounds present. Non-tender.  Extremities: No lower extremity edema. Pulses are 2 + in the bilateral DP/PT.  Echo 01/27/13: Left ventricle: The cavity size was normal. Wall thickness was increased in a pattern of mild LVH. Systolic function was normal. The estimated ejection fraction was in the range of 60% to 65%. Hypokinesis of the entire myocardium. Left ventricular diastolic function parameters were normal. - Atrial septum: No defect or patent foramen ovale  was Identified.  EKG:  EKG is ordered today. The ekg ordered today demonstrates Sinus brady, rate 53 bpm. Poor R wave progression precordial leads  Recent Labs: No results found for requested labs within last 365 days.   Lipid Panel: Followed in primary care   Wt Readings from Last 3 Encounters:  01/24/15 233 lb (105.688 kg)  02/23/13 225 lb (102.059 kg)  10/28/12 238 lb (107.956 kg)     Other studies Reviewed: Additional studies/ records that were reviewed today include: . Review of the above records demonstrates:    Assessment and Plan:   1. CAD: MI 10/21/12 in [place]. 3 overlapping DES in mid LAD. Moderate disease in RCA and Circumflex. Stable by cath February 2015 at [facility] per Dr. Ellyn Hack with stable disease. Will stop ASA, Effient due to easy bruising and will start Plavix 75 mg daily. Will continue  beta blocker, statin, Ace-inh.    2. Ischemic cardiomyopathy: LVEF normal by echo 01/28/13.  Continue meds.   3. HLD: Continue statin. Lipids followed in primary care.   Current medicines are reviewed at length with the patient today.  The patient does not have concerns regarding medicines.  The following changes have been made:  no change  Labs/ tests ordered today include:   Orders Placed This Encounter  Procedures  . EKG 12-Lead    Disposition:   FU with me in 12  months  Signed, Lauree Chandler, MD 01/24/2015 [facility] [address], [place], [place]  [address] Phone: [phone]; Fax: [phone]

## 2015-01-24 NOTE — Patient Instructions (Signed)
Medication Instructions:  Your physician has recommended you make the following change in your medication:  Stop Effient. Stop aspirin. Start Clopidogrel 75 mg by mouth daily.   Labwork: none  Testing/Procedures: none  Follow-Up: Your physician wants you to follow-up in: 12 months.  You will receive a reminder letter in the mail two months in advance. If you don't receive a letter, please call our office to schedule the follow-up appointment.   Any Other Special Instructions Will Be Listed Below (If Applicable).

## 2015-06-19 ENCOUNTER — Telehealth: Payer: Self-pay | Admitting: Cardiovascular Disease

## 2015-06-19 NOTE — Telephone Encounter (Signed)
error 

## 2015-07-24 ENCOUNTER — Ambulatory Visit: Payer: BLUE CROSS/BLUE SHIELD | Admitting: Anesthesiology

## 2015-07-24 ENCOUNTER — Encounter
Admission: RE | Disposition: A | Discharge status: Home / Self Care | Payer: Self-pay | Source: Ambulatory Visit | Attending: Gastroenterology

## 2015-07-24 ENCOUNTER — Ambulatory Visit
Admission: RE | Admit: 2015-07-24 | Discharge: 2015-07-24 | Disposition: A | Discharge status: Home / Self Care | Payer: BLUE CROSS/BLUE SHIELD | Source: Ambulatory Visit | Attending: Gastroenterology | Admitting: Gastroenterology

## 2015-07-24 ENCOUNTER — Encounter: Payer: Self-pay | Admitting: Anesthesiology

## 2015-07-24 DIAGNOSIS — D127 Benign neoplasm of rectosigmoid junction: Secondary | ICD-10-CM | POA: Diagnosis not present

## 2015-07-24 DIAGNOSIS — Z1211 Encounter for screening for malignant neoplasm of colon: Secondary | ICD-10-CM | POA: Insufficient documentation

## 2015-07-24 DIAGNOSIS — K5730 Diverticulosis of large intestine without perforation or abscess without bleeding: Secondary | ICD-10-CM | POA: Insufficient documentation

## 2015-07-24 DIAGNOSIS — Z79899 Other long term (current) drug therapy: Secondary | ICD-10-CM | POA: Diagnosis not present

## 2015-07-24 DIAGNOSIS — I2510 Atherosclerotic heart disease of native coronary artery without angina pectoris: Secondary | ICD-10-CM | POA: Diagnosis not present

## 2015-07-24 DIAGNOSIS — G4733 Obstructive sleep apnea (adult) (pediatric): Secondary | ICD-10-CM | POA: Insufficient documentation

## 2015-07-24 DIAGNOSIS — K621 Rectal polyp: Secondary | ICD-10-CM | POA: Insufficient documentation

## 2015-07-24 DIAGNOSIS — D122 Benign neoplasm of ascending colon: Secondary | ICD-10-CM | POA: Diagnosis not present

## 2015-07-24 DIAGNOSIS — E119 Type 2 diabetes mellitus without complications: Secondary | ICD-10-CM | POA: Insufficient documentation

## 2015-07-24 DIAGNOSIS — I252 Old myocardial infarction: Secondary | ICD-10-CM | POA: Insufficient documentation

## 2015-07-24 DIAGNOSIS — E785 Hyperlipidemia, unspecified: Secondary | ICD-10-CM | POA: Insufficient documentation

## 2015-07-24 HISTORY — DX: Acute myocardial infarction, unspecified: I21.9

## 2015-07-24 HISTORY — PX: COLONOSCOPY WITH PROPOFOL: SHX5780

## 2015-07-24 LAB — GLUCOSE, CAPILLARY: Glucose-Capillary: 109 mg/dL — ABNORMAL HIGH (ref 65–99)

## 2015-07-24 SURGERY — COLONOSCOPY WITH PROPOFOL
Anesthesia: General

## 2015-07-24 MED ORDER — SODIUM CHLORIDE 0.9 % IV SOLN: INTRAVENOUS | Status: DC

## 2015-07-24 MED ORDER — SODIUM CHLORIDE 0.9 % IV SOLN
INTRAVENOUS | Status: DC
  Administered 2015-07-24: 1000 mL via INTRAVENOUS

## 2015-07-24 MED ORDER — EPHEDRINE SULFATE 50 MG/ML IJ SOLN
INTRAMUSCULAR | Status: DC | PRN
  Administered 2015-07-24 (×2): 5 mg via INTRAVENOUS

## 2015-07-24 MED ORDER — FENTANYL CITRATE (PF) 100 MCG/2ML IJ SOLN
INTRAMUSCULAR | Status: DC | PRN
  Administered 2015-07-24: 50 ug via INTRAVENOUS

## 2015-07-24 MED ORDER — PROPOFOL 500 MG/50ML IV EMUL
INTRAVENOUS | Status: DC | PRN
  Administered 2015-07-24: 120 ug/kg/min via INTRAVENOUS

## 2015-07-24 MED ORDER — MIDAZOLAM HCL 2 MG/2ML IJ SOLN
INTRAMUSCULAR | Status: DC | PRN
  Administered 2015-07-24: 1 mg via INTRAVENOUS

## 2015-07-24 NOTE — H&P (Signed)
Outpatient short stay form Pre-procedure 07/24/2015 9:51 AM Lollie Sails MD  Primary Physician: Dr Juluis Pitch  Reason for visit:  Colonoscopy  History of present illness:  Justin Mckinney is a 59 year old male presenting today for screening colonoscopy. His last colonoscopy was about 10 years ago. He does take Plavix however has stopped that for 6 days. Takes no aspirin products or other anticoagulants. Tolerated his prep well.    Current facility-administered medications:  .  0.9 %  sodium chloride infusion, , Intravenous, Continuous, Lollie Sails, MD, Last Rate: 20 mL/hr at 07/24/15 0848, 1,000 mL at 07/24/15 0848 .  0.9 %  sodium chloride infusion, , Intravenous, Continuous, Lollie Sails, MD  Prescriptions prior to admission  Medication Sig Dispense Refill Last Dose  . clopidogrel (PLAVIX) 75 MG tablet Take 1 tablet (75 mg total) by mouth daily. 90 tablet 3 07/19/2015 at Unknown time  . atorvastatin (LIPITOR) 80 MG tablet TAKE ONE TABLET DAILY   Taking  . carvedilol (COREG) 3.125 MG tablet Take 1 tablet (3.125 mg total) by mouth 2 (two) times daily. 180 tablet 3 Taking  . JANUMET XR 50-1000 MG TB24 1 tab twice a day   Taking  . lisinopril (PRINIVIL,ZESTRIL) 2.5 MG tablet TAKE ONE TABLET TWICE DAILY   Taking  . Multiple Vitamins-Minerals (CENTRUM SILVER ADULT 50+ PO) TAKE ONE TABLET DAILY   Taking  . NITROSTAT 0.4 MG SL tablet prn   Taking  . Omega-3 Fatty Acids (OMEGA-3 FISH OIL PO) Take by mouth. TAKE ONE CAPSULE DAILY   Taking  . sitaGLIPtin (JANUVIA) 100 MG tablet Take 100 mg by mouth daily.   Taking     No Known Allergies   Past Medical History  Diagnosis Date  . CAD (coronary artery disease)     Anterior STEMI 10/21/12, treated with PCI of LAD, 3 overlapping DES mid LAD  . DM (diabetes mellitus) ([place])     Diagnosed 2012  . Hyperlipidemia   . Obstructive sleep apnea   . Myocardial infarction [facility])     Review of systems:      Physical Exam    Heart and  lungs: Regular rate and rhythm without rub or gallop, lungs are bilaterally clear.    HEENT: Normocephalic atraumatic eyes are anicteric    Other:     Pertinant exam for procedure: Soft nontender nondistended bowel sounds positive normoactive.    Planned proceedures: Colonoscopy and indicated procedures. I have discussed the risks benefits and complications of procedures to include not limited to bleeding, infection, perforation and the risk of sedation and the patient wishes to proceed.    Lollie Sails, MD Gastroenterology 07/24/2015  9:51 AM

## 2015-07-24 NOTE — Anesthesia Preprocedure Evaluation (Signed)
Anesthesia Evaluation  Patient identified by MRN, date of birth, ID band Patient awake    Reviewed: Allergy & Precautions, NPO status , Patient's Chart, lab work & pertinent test results, reviewed documented beta blocker date and time   Airway Mallampati: II  TM Distance: >3 FB     Dental  (+) Chipped   Pulmonary sleep apnea ,           Cardiovascular + CAD       Neuro/Psych    GI/Hepatic   Endo/Other  diabetes, Type 2  Renal/GU      Musculoskeletal   Abdominal   Peds  Hematology   Anesthesia Other Findings MI with stent place. Will use cpap.  Reproductive/Obstetrics                             Anesthesia Physical Anesthesia Plan  ASA: III  Anesthesia Plan: General   Post-op Pain Management:    Induction: Intravenous  Airway Management Planned: Nasal Cannula  Additional Equipment:   Intra-op Plan:   Post-operative Plan:   Informed Consent: I have reviewed the patients History and Physical, chart, labs and discussed the procedure including the risks, benefits and alternatives for the proposed anesthesia with the patient or authorized representative who has indicated his/her understanding and acceptance.     Plan Discussed with: CRNA  Anesthesia Plan Comments:         Anesthesia Quick Evaluation

## 2015-07-24 NOTE — Anesthesia Procedure Notes (Signed)
Performed by: COOK-MARTIN, [place] Pre-anesthesia Checklist: Patient identified, Emergency Drugs available, Suction available, Patient being monitored and Timeout performed Patient Re-evaluated:Patient Re-evaluated prior to inductionOxygen Delivery Method: Nasal cannula Preoxygenation: Pre-oxygenation with 100% oxygen Intubation Type: IV induction Placement Confirmation: positive ETCO2 and CO2 detector       

## 2015-07-24 NOTE — Transfer of Care (Signed)
Immediate Anesthesia Transfer of Care Note  Patient: Justin Mckinney  Procedure(s) Performed: Procedure(s): COLONOSCOPY WITH PROPOFOL (N/A)  Patient Location: PACU  Anesthesia Type:General  Level of Consciousness: awake and sedated  Airway & Oxygen Therapy: Patient Spontanous Breathing and Patient connected to nasal cannula oxygen  Post-op Assessment: Report given to RN and Post -op Vital signs reviewed and stable  Post vital signs: Reviewed and stable  Last Vitals:  Filed Vitals:   07/24/15 0839  BP: 118/80  Pulse: 58  Temp: 35.7 C  Resp: 18    Complications: No apparent anesthesia complications

## 2015-07-24 NOTE — Op Note (Signed)
[facility] Patient Name: Justin Mckinney Procedure Date: 07/24/2015 9:50 AM MRN: [id number] Account #: [id number] Date of Birth: 1956/06/22 Admit Type: Outpatient Age: 59 Room: [facility] ENDO ROOM 3 Gender: Male Note Status: Finalized Procedure:         Colonoscopy Indications:       Screening for colorectal malignant neoplasm Providers:         Lollie Sails, MD Referring MD:      Youlanda Roys. Lovie Macadamia, MD (Referring MD) Medicines:         Monitored Anesthesia Care Complications:     No immediate complications. Procedure:         Pre-Anesthesia Assessment:                    - ASA Grade Assessment: III - A patient with severe                     systemic disease.                    After obtaining informed consent, the colonoscope was                     passed under direct vision. Throughout the procedure, the                     patient's blood pressure, pulse, and oxygen saturations                     were monitored continuously. The Colonoscope was                     introduced through the anus and advanced to the the cecum,                     identified by appendiceal orifice and ileocecal valve. The                     colonoscopy was performed without difficulty. The patient                     tolerated the procedure well. The quality of the bowel                     preparation was fair. Findings:      Three sessile polyps were found in the recto-sigmoid colon. The polyps       were 1 to 3 mm in size. These polyps were removed with a cold biopsy       forceps. Resection and retrieval were complete.      A 5 mm polyp was found in the proximal ascending colon. The polyp was       sessile. The polyp was removed with a cold biopsy forceps. Resection and       retrieval were complete.      Two sessile polyps were found in the rectum. The polyps were 1 to 2 mm       in size. These polyps were removed with a cold biopsy forceps. Resection     and retrieval were complete.      Multiple small-mouthed diverticula were found in the sigmoid colon, in       the descending colon and in the transverse colon.      The digital rectal exam was normal.      The  retroflexed view of the distal rectum and anal verge was normal and       showed no anal or rectal abnormalities. Impression:        - Three 1 to 3 mm polyps at the recto-sigmoid colon.                     Resected and retrieved.                    - One 5 mm polyp in the proximal ascending colon. Resected                     and retrieved.                    - Two 1 to 2 mm polyps in the rectum. Resected and                     retrieved.                    - Diverticulosis in the sigmoid colon, in the descending                     colon and in the transverse colon.                    - The distal rectum and anal verge are normal on                     retroflexion view. Recommendation:    - Await pathology results.                    - Telephone GI clinic for pathology results in 1 week. Procedure Code(s): --- Professional ---                    [id number], Colonoscopy, flexible; with biopsy, single or                     multiple Diagnosis Code(s): --- Professional ---                    Z12.11, Encounter for screening for malignant neoplasm of                     colon                    D12.7, Benign neoplasm of rectosigmoid junction                    K62.1, Rectal polyp                    D12.2, Benign neoplasm of ascending colon                    K57.30, Diverticulosis of large intestine without                     perforation or abscess without bleeding CPT copyright 2014 American Medical Association. All rights reserved. The codes documented in this report are preliminary and upon coder review may  be revised to meet current compliance requirements. Lollie Sails, MD 07/24/2015 10:33:15 AM This report has been signed electronically. Number of Addenda: 0 Note  Initiated On: 07/24/2015 9:50 AM Scope Withdrawal Time: 0 hours 7 minutes  9 seconds  Total Procedure Duration: 0 hours 26 minutes 14 seconds       [facility]

## 2015-07-24 NOTE — Anesthesia Postprocedure Evaluation (Signed)
Anesthesia Post Note  Patient: Justin Mckinney  Procedure(s) Performed: Procedure(s) (LRB): COLONOSCOPY WITH PROPOFOL (N/A)  Patient location during evaluation: Endoscopy Anesthesia Type: General Level of consciousness: awake Pain management: pain level controlled Vital Signs Assessment: post-procedure vital signs reviewed and stable Respiratory status: spontaneous breathing Cardiovascular status: blood pressure returned to baseline Anesthetic complications: no    Last Vitals:  Filed Vitals:   07/24/15 1050 07/24/15 1100  BP: 118/76 112/74  Pulse: 60 55  Temp:    Resp: 20 15    Last Pain: There were no vitals filed for this visit.               Stevon Gough S

## 2015-07-25 ENCOUNTER — Encounter: Payer: Self-pay | Admitting: Gastroenterology

## 2015-07-25 LAB — SURGICAL PATHOLOGY

## 2015-08-13 IMAGING — CR DG CHEST 2V
1 series · 3 of 3 positions shown · non-contrast
Comparison: None.

CLINICAL DATA: Chest pain.

EXAM:
CHEST  2 VIEW

[Series 1: pa · 0.17mm/px · 3 of 3 slices shown]
[im 1/3]
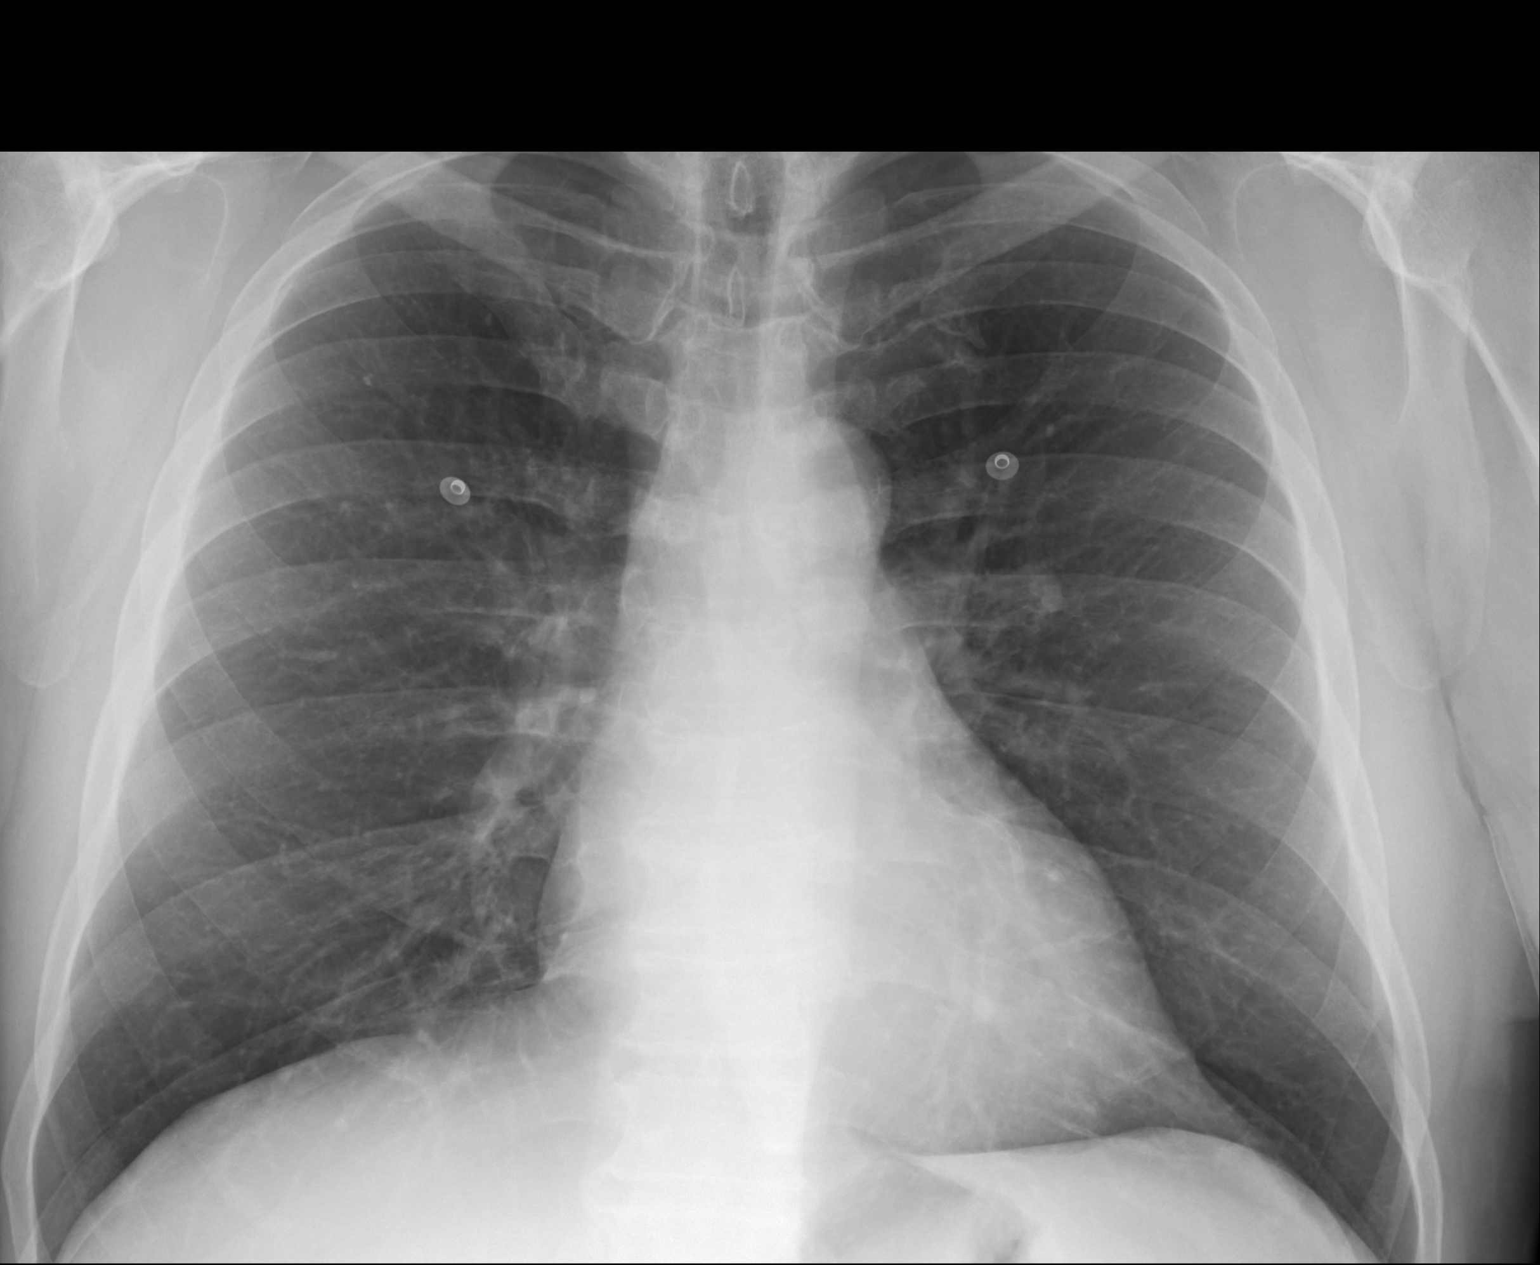
[im 2/3]
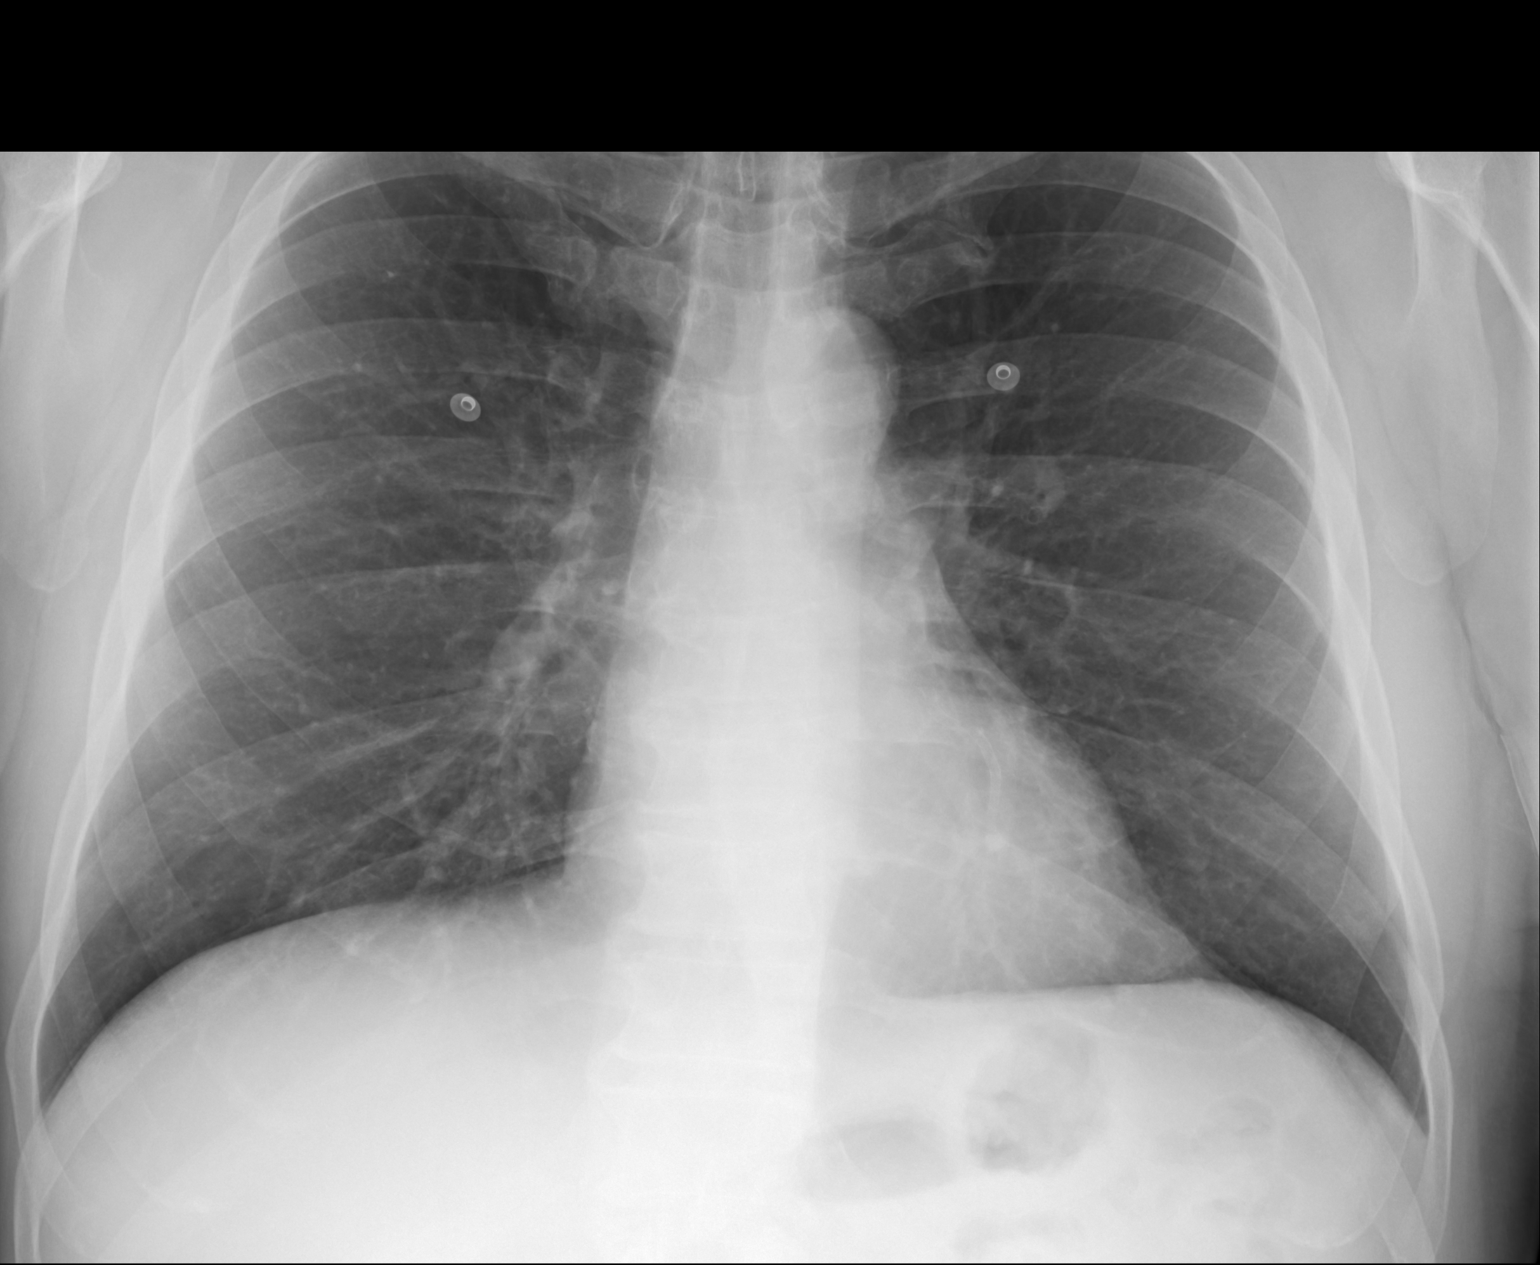
[im 3/3]
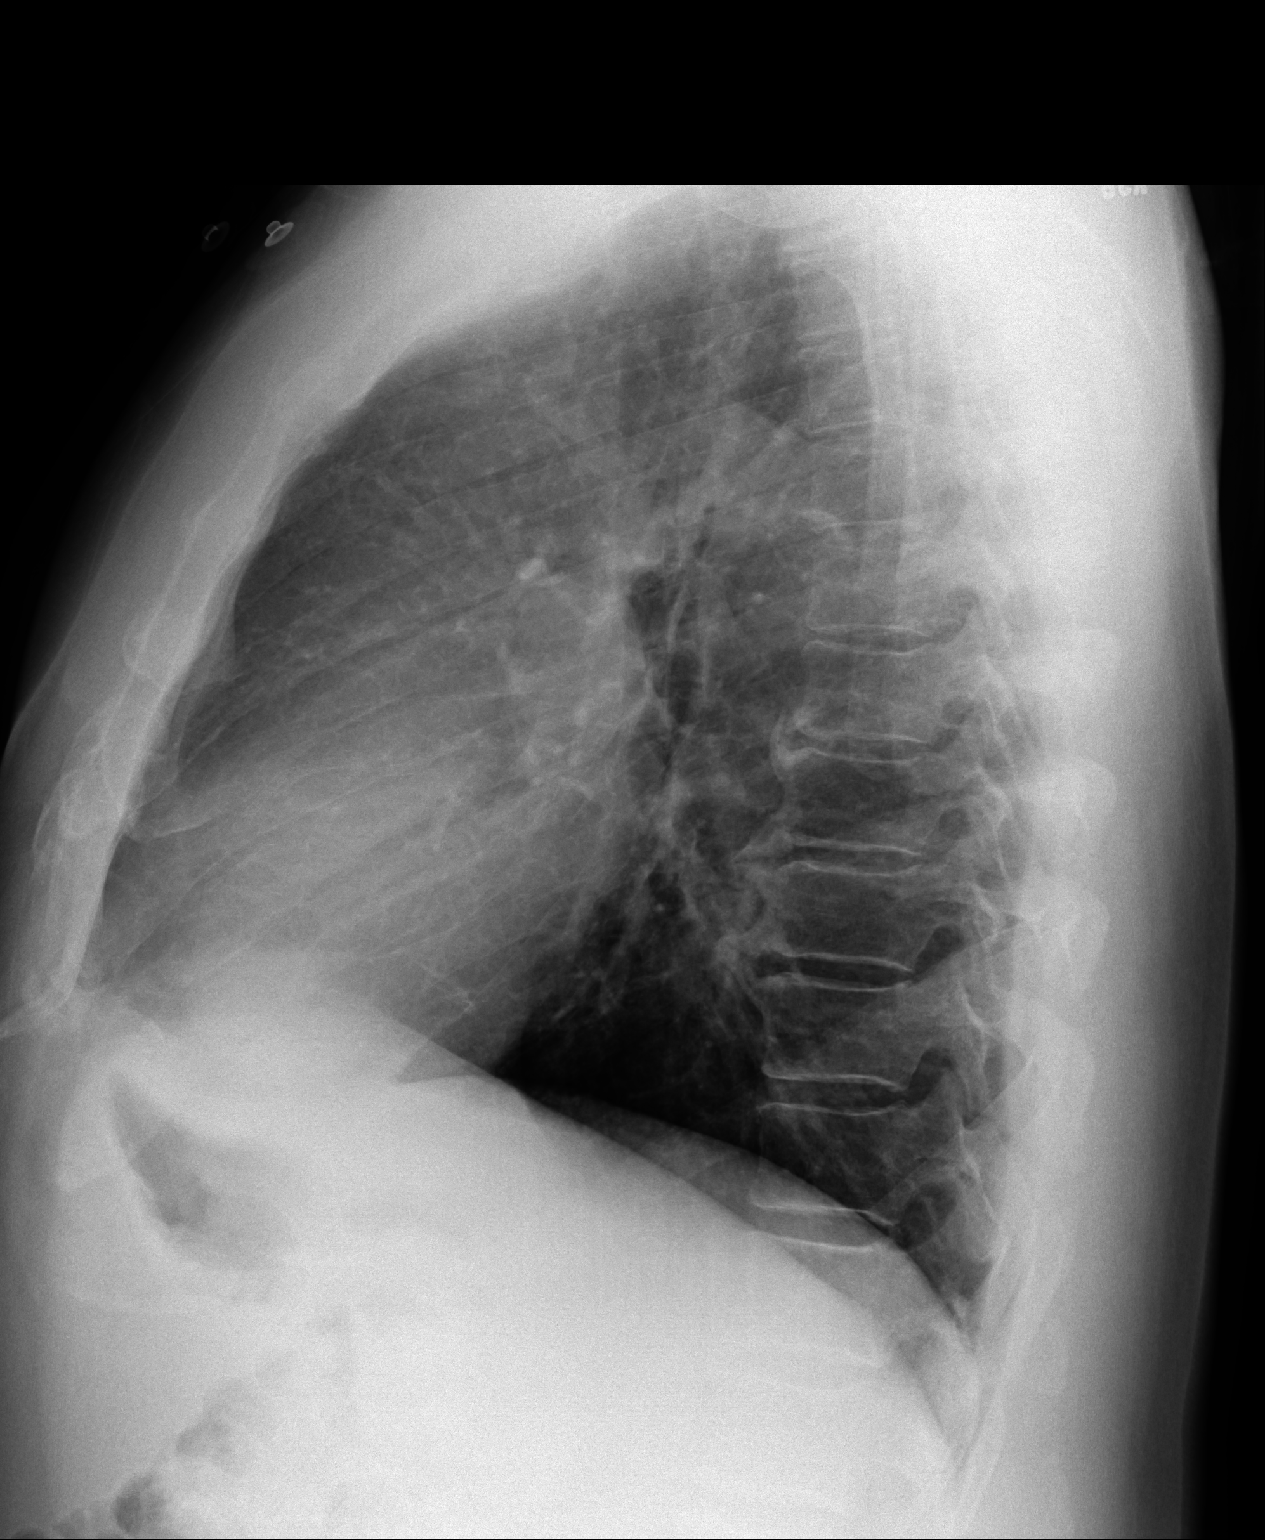

[3 of 3 positions shown; findings below may reference images not displayed]

FINDINGS: Cardiopericardial silhouette within normal limits. Mediastinal
contours normal. Trachea midline. No airspace disease or effusion.
IMPRESSION: No active cardiopulmonary disease.

## 2016-03-20 ENCOUNTER — Other Ambulatory Visit: Payer: Self-pay | Admitting: Cardiovascular Disease

## 2016-05-23 ENCOUNTER — Ambulatory Visit (INDEPENDENT_AMBULATORY_CARE_PROVIDER_SITE_OTHER): Payer: BLUE CROSS/BLUE SHIELD | Admitting: Cardiovascular Disease

## 2016-05-23 ENCOUNTER — Encounter: Payer: Self-pay | Admitting: Cardiovascular Disease

## 2016-05-23 VITALS — BP 116/80 | HR 60 | Ht 70.0 in | Wt 239.6 lb

## 2016-05-23 DIAGNOSIS — I255 Ischemic cardiomyopathy: Secondary | ICD-10-CM

## 2016-05-23 DIAGNOSIS — E7800 Pure hypercholesterolemia, unspecified: Secondary | ICD-10-CM

## 2016-05-23 DIAGNOSIS — I2510 Atherosclerotic heart disease of native coronary artery without angina pectoris: Secondary | ICD-10-CM

## 2016-05-23 MED ORDER — NITROGLYCERIN 0.4 MG SL SUBL: 0.4000 mg | SUBLINGUAL_TABLET | SUBLINGUAL | 6 refills | Status: DC | PRN

## 2016-05-23 NOTE — Patient Instructions (Signed)

## 2016-05-23 NOTE — Progress Notes (Signed)
Chief Complaint  Patient presents with  . Follow-up    pt states no chest pain   . Shortness of Breath    some due to his sinus     History of Present Illness: 60 yo male with history of CAD with MI March 2014, HLD, DM, OSA who is here today for cardiology follow up. He was in [place], [place] on 10/21/12 when he suffered an anterior wall MI. Emergent cardiac cath with 99% mid LAD stenosis, Diagonal 2 with 50% ostial stenosis, 50% mid Circumflex stenosis, 1st OM ostial 40% stenosis, 50% mid RCA stenosis. Three overlapping DES were placed in the mid LAD (2.75 x 32 mm Promus Premier, 2.5 x 24 mm Promus Premier, 2.25 x 12 mm Promus Premier). LVEF 35% by echo. He was treated with ASA and Effient as well as beta blocker, Ace-inh and statin. Echo 01/28/13 with LVEF 60-65%. Admitted to [facility] January 2015 with sharp chest pain. Cath per Dr. Ellyn Hack February 2015 with stable disease. Patent stents LAD.   He is doing well. No chest pain or SOB. Tolerating meds. He has not been exercising.    Primary Care Physician: Juluis Pitch, MD  Past Medical History:  Diagnosis Date  . CAD (coronary artery disease)    Anterior STEMI 10/21/12, treated with PCI of LAD, 3 overlapping DES mid LAD  . DM (diabetes mellitus) ([place])    Diagnosed 2012  . Hyperlipidemia   . Myocardial infarction   . Obstructive sleep apnea     Past Surgical History:  Procedure Laterality Date  . CARDIAC CATHETERIZATION    . COLONOSCOPY WITH PROPOFOL N/A 07/24/2015   Procedure: COLONOSCOPY WITH PROPOFOL;  Surgeon: Lollie Sails, MD;  Location: [facility] ENDOSCOPY;  Service: Endoscopy;  Laterality: N/A;  . CORONARY ARTERY BYPASS GRAFT    . ROTATOR CUFF REPAIR  2004  . TONSILLECTOMY      Current Outpatient Prescriptions  Medication Sig Dispense Refill  . atorvastatin (LIPITOR) 80 MG tablet TAKE ONE TABLET DAILY    . carvedilol (COREG) 3.125 MG tablet Take 1 tablet (3.125 mg total) by mouth 2 (two) times daily. 180 tablet 3  .  clopidogrel (PLAVIX) 75 MG tablet TAKE 1 TABLET DAILY 90 tablet 0  . JANUMET XR 50-1000 MG TB24 1 tab twice a day    . lisinopril (PRINIVIL,ZESTRIL) 2.5 MG tablet TAKE ONE TABLET TWICE DAILY    . Multiple Vitamins-Minerals (CENTRUM SILVER ADULT 50+ PO) TAKE ONE TABLET DAILY    . NITROSTAT 0.4 MG SL tablet prn    . Omega-3 Fatty Acids (OMEGA-3 FISH OIL PO) Take by mouth. TAKE ONE CAPSULE DAILY    . sitaGLIPtin (JANUVIA) 100 MG tablet Take 100 mg by mouth daily.     No current facility-administered medications for this visit.     No Known Allergies  Social History   Social History  . Marital status: Married    Spouse name: N/A  . Number of children: 2  . Years of education: N/A   Occupational History  . Engineer, mining [employer]   Social History Main Topics  . Smoking status: Former Research scientist (life sciences)  . Smokeless tobacco: Not on file  . Alcohol use No  . Drug use: No  . Sexual activity: Not on file   Other Topics Concern  . Not on file   Social History Narrative  . No narrative on file    Family History  Problem Relation Age of Onset  . Heart attack Father   .  Stroke Mother   . Stroke Brother     Review of Systems:  As stated in the HPI and otherwise negative.   BP 116/80   Pulse 60   Ht 5\' 10"  (1.778 m)   Wt 239 lb 9.6 oz (108.7 kg)   BMI 34.38 kg/m   Physical Examination: General: Well developed, well nourished, NAD  HEENT: OP clear, mucus membranes moist  SKIN: warm, dry. No rashes. Neuro: No focal deficits  Musculoskeletal: Muscle strength 5/5 all ext  Psychiatric: Mood and affect normal  Neck: No JVD, no carotid bruits, no thyromegaly, no lymphadenopathy.  Lungs:Clear bilaterally, no wheezes, rhonci, crackles Cardiovascular: Regular rate and rhythm. No murmurs, gallops or rubs. Abdomen:Soft. Bowel sounds present. Non-tender.  Extremities: No lower extremity edema. Pulses are 2 + in the bilateral DP/PT.  Echo 01/27/13: Left ventricle: The cavity size was normal.  Wall thickness was increased in a pattern of mild LVH. Systolic function was normal. The estimated ejection fraction was in the range of 60% to 65%. Hypokinesis of the entire myocardium. Left ventricular diastolic function parameters were normal. - Atrial septum: No defect or patent foramen ovale was Identified.  EKG:  EKG is ordered today. The ekg ordered today demonstrates NSR, rate 60 bpm. Poor R wave progression precordial leads.   Recent Labs: No results found for requested labs within last 8760 hours.   Lipid Panel: Followed in primary care   Wt Readings from Last 3 Encounters:  05/23/16 239 lb 9.6 oz (108.7 kg)  07/24/15 230 lb (104.3 kg)  01/24/15 233 lb (105.7 kg)     Other studies Reviewed: Additional studies/ records that were reviewed today include: . Review of the above records demonstrates:    Assessment and Plan:   1. CAD: MI 10/21/12 in [place]. 3 overlapping DES placed in mid LAD. Moderate disease in RCA and Circumflex. Stable by cath February 2015 at [facility] per Dr. Ellyn Hack. Will continue Plavix, beta blocker, statin, Ace-inh.    2. Ischemic cardiomyopathy: LVEF normal by echo 01/28/13.  Continue current meds.   3. HLD: Continue statin. Lipids followed in primary care.   Current medicines are reviewed at length with the patient today.  The patient does not have concerns regarding medicines.  The following changes have been made:  no change  Labs/ tests ordered today include:   No orders of the defined types were placed in this encounter.   Disposition:   FU with me in 12  months  Signed, Lauree Chandler, MD 05/23/2016 11:24 AM    [facility] [place], [place], [place]  [address] Phone: [phone]; Fax: [phone]

## 2016-05-23 NOTE — Addendum Note (Signed)
Addended by: Thompson Grayer on: 05/23/2016 11:26 AM   Modules accepted: Orders

## 2016-07-09 ENCOUNTER — Other Ambulatory Visit: Payer: Self-pay | Admitting: Cardiovascular Disease

## 2017-06-19 ENCOUNTER — Ambulatory Visit
Admission: EM | Admit: 2017-06-19 | Discharge: 2017-06-19 | Disposition: A | Discharge status: Home / Self Care | Payer: BLUE CROSS/BLUE SHIELD | Attending: Family Medicine | Admitting: Family Medicine

## 2017-06-19 ENCOUNTER — Other Ambulatory Visit: Payer: Self-pay

## 2017-06-19 DIAGNOSIS — Z23 Encounter for immunization: Secondary | ICD-10-CM | POA: Diagnosis not present

## 2017-06-19 DIAGNOSIS — W208XXA Other cause of strike by thrown, projected or falling object, initial encounter: Secondary | ICD-10-CM

## 2017-06-19 DIAGNOSIS — S0181XA Laceration without foreign body of other part of head, initial encounter: Secondary | ICD-10-CM | POA: Diagnosis not present

## 2017-06-19 MED ORDER — TETANUS-DIPHTH-ACELL PERTUSSIS 5-2.5-18.5 LF-MCG/0.5 IM SUSP
0.5000 mL | Freq: Once | INTRAMUSCULAR | Status: AC
  Administered 2017-06-19: 0.5 mL via INTRAMUSCULAR

## 2017-06-19 NOTE — ED Provider Notes (Signed)
[facility]    CSN: [id number] Arrival date & time: 06/19/17  1239     History   Chief Complaint Chief Complaint  Patient presents with  . Facial Laceration    HPI Justin Mckinney is a 61 y.o. male.   61 yo male with a c/o a chin laceration about 2 hours ago while pulling a tree limb. States limb flipped back up and hit him on the chin. Denies loss of consciousness. Does not recall last tetanus vaccine.    The history is provided by the patient.    Past Medical History:  Diagnosis Date  . CAD (coronary artery disease)    Anterior STEMI 10/21/12, treated with PCI of LAD, 3 overlapping DES mid LAD  . DM (diabetes mellitus) ([place])    Diagnosed 2012  . Hyperlipidemia   . Myocardial infarction ([place])   . Obstructive sleep apnea     Patient Active Problem List   Diagnosis Date Noted  . Coronary atherosclerosis of native coronary artery 10/28/2012  . Hyperlipidemia 10/28/2012  . Diabetes mellitus ([place]) 10/28/2012    Past Surgical History:  Procedure Laterality Date  . CARDIAC CATHETERIZATION    . COLONOSCOPY WITH PROPOFOL N/A 07/24/2015   Procedure: COLONOSCOPY WITH PROPOFOL;  Surgeon: Lollie Sails, MD;  Location: [facility] ENDOSCOPY;  Service: Endoscopy;  Laterality: N/A;  . CORONARY ARTERY BYPASS GRAFT    . ROTATOR CUFF REPAIR  2004  . TONSILLECTOMY         Home Medications    Prior to Admission medications   Medication Sig Start Date End Date Taking? Authorizing Provider  atorvastatin (LIPITOR) 20 MG tablet TAKE ONE TABLET DAILY 10/25/12  Yes [provider]  carvedilol (COREG) 3.125 MG tablet Take 1 tablet (3.125 mg total) by mouth 2 (two) times daily. 09/09/13  Yes Burnell Blanks, MD  JANUMET XR 50-1000 MG TB24 1 tab twice a day 06/10/13  Yes [provider]  lisinopril (PRINIVIL,ZESTRIL) 2.5 MG tablet TAKE ONE TABLET TWICE DAILY 10/25/12  Yes [provider]  Multiple Vitamins-Minerals (CENTRUM SILVER ADULT 50+  PO) TAKE ONE TABLET DAILY   Yes [provider]  nitroGLYCERIN (NITROSTAT) 0.4 MG SL tablet Place 1 tablet (0.4 mg total) under the tongue every 5 (five) minutes as needed for chest pain. 05/23/16  Yes Burnell Blanks, MD  Omega-3 Fatty Acids (OMEGA-3 FISH OIL PO) Take by mouth. TAKE ONE CAPSULE DAILY   Yes [provider]  sitaGLIPtin (JANUVIA) 100 MG tablet Take 100 mg by mouth daily.   Yes [provider]  clopidogrel (PLAVIX) 75 MG tablet Take 1 tablet (75 mg total) by mouth daily. 07/09/16   Burnell Blanks, MD    Family History Family History  Problem Relation Age of Onset  . Heart attack Father   . Stroke Mother   . Stroke Brother     Social History Social History   Tobacco Use  . Smoking status: Former Research scientist (life sciences)  . Smokeless tobacco: Never Used  Substance Use Topics  . Alcohol use: No  . Drug use: No     Allergies   Patient has no known allergies.   Review of Systems Review of Systems   Physical Exam Triage Vital Signs ED Triage Vitals  Enc Vitals Group     BP 06/19/17 1330 129/80     Pulse Rate 06/19/17 1330 62     Resp 06/19/17 1330 18     Temp 06/19/17 1330 98.6 F (37 C)  Temp Source 06/19/17 1330 Oral     SpO2 06/19/17 1330 100 %     Weight 06/19/17 1327 240 lb (108.9 kg)     Height 06/19/17 1327 5\' 10"  (1.778 m)     Head Circumference --      Peak Flow --      Pain Score 06/19/17 1327 6     Pain Loc --      Pain Edu? --      Excl. in [place]? --    No data found.  Updated Vital Signs BP 129/80 (BP Location: Left Arm)   Pulse 62   Temp 98.6 F (37 C) (Oral)   Resp 18   Ht 5\' 10"  (1.778 m)   Wt 240 lb (108.9 kg)   SpO2 100%   BMI 34.44 kg/m   Visual Acuity Right Eye Distance:   Left Eye Distance:   Bilateral Distance:    Right Eye Near:   Left Eye Near:    Bilateral Near:     Physical Exam  Constitutional: He appears well-developed and well-nourished. No distress.  Skin: He is not  diaphoretic.  1cm chin laceration  Nursing note and vitals reviewed.    UC Treatments / Results  Labs (all labs ordered are listed, but only abnormal results are displayed) Labs Reviewed - No data to display  EKG  EKG Interpretation None       Radiology No results found.  Procedures Laceration Repair Date/Time: 06/19/2017 5:19 PM Performed by: Norval Gable, MD Authorized by: Norval Gable, MD   Consent:    Consent obtained:  Verbal   Consent given by:  Patient   Risks discussed:  Infection, need for additional repair, nerve damage, poor cosmetic result, pain, poor wound healing, retained foreign body and vascular damage   Alternatives discussed:  No treatment Anesthesia (see MAR for exact dosages):    Anesthesia method:  Topical application   Topical anesthetic:  LET Laceration details:    Location:  Face   Face location:  Chin   Length (cm):  1 Repair type:    Repair type:  Simple Pre-procedure details:    Preparation:  Patient was prepped and draped in usual sterile fashion Exploration:    Hemostasis achieved with:  Direct pressure and LET   Wound exploration: wound explored through full range of motion and entire depth of wound probed and visualized     Contaminated: no   Treatment:    Area cleansed with:  Hibiclens   Amount of cleaning:  Standard   Irrigation solution:  Sterile saline Skin repair:    Repair method:  Sutures   Suture size:  5-0   Suture material:  Nylon   Suture technique:  Simple interrupted   Number of sutures:  2 Approximation:    Approximation:  Close Post-procedure details:    Dressing:  Antibiotic ointment   Patient tolerance of procedure:  Tolerated well, no immediate complications   (including critical care time)  Medications Ordered in UC Medications  Tdap (BOOSTRIX) injection 0.5 mL (0.5 mLs Intramuscular Given 06/19/17 1418)     Initial Impression / Assessment and Plan / UC Course  I have reviewed the triage  vital signs and the nursing notes.  Pertinent labs & imaging results that were available during my care of the patient were reviewed by me and considered in my medical decision making (see chart for details).      Final Clinical Impressions(s) / UC Diagnoses   Final diagnoses:  Chin laceration, initial encounter    ED Discharge Orders    None     1. diagnosis reviewed with patient 2. Procedure as per note above 3. Recommend supportive treatment with routine wound care 4. Tetanus vaccine given 5. Follow-up prn if symptoms worsen or don't improve   Controlled Substance Prescriptions [place] Controlled Substance Registry consulted? Not Applicable   Norval Gable, MD 06/19/17 [phone]

## 2017-06-19 NOTE — ED Triage Notes (Signed)
Patient states that he was pulling at a tree limb today and states that the limb flipped up and hit across the chin. Patient states that the impact knocked him down a hill where he did a few flips. Patient reports no loss of conciousness. Patient does states that with the laceration to his chin, that he is experiencing some jaw bilaterally mainly at the joint. Patient states that he does have a headache, but took advil and has eased off.

## 2017-08-10 ENCOUNTER — Ambulatory Visit: Payer: BLUE CROSS/BLUE SHIELD | Admitting: Cardiovascular Disease

## 2017-08-10 ENCOUNTER — Encounter: Payer: Self-pay | Admitting: Cardiovascular Disease

## 2017-08-10 VITALS — BP 126/80 | HR 69 | Ht 70.0 in | Wt 236.8 lb

## 2017-08-10 DIAGNOSIS — I255 Ischemic cardiomyopathy: Secondary | ICD-10-CM | POA: Diagnosis not present

## 2017-08-10 DIAGNOSIS — I2510 Atherosclerotic heart disease of native coronary artery without angina pectoris: Secondary | ICD-10-CM | POA: Diagnosis not present

## 2017-08-10 DIAGNOSIS — E7800 Pure hypercholesterolemia, unspecified: Secondary | ICD-10-CM

## 2017-08-10 NOTE — Progress Notes (Signed)
Chief Complaint  Patient presents with  . Follow-up    cad     History of Present Illness: 62 yo male with history of CAD with MI March 2014, HLD, DM, OSA who is here today for cardiology follow up. He was in [place], [place] on 10/21/12 when he suffered an anterior wall MI. Emergent cardiac cath with 99% mid LAD stenosis, Diagonal 2 with 50% ostial stenosis, 50% mid Circumflex stenosis, 1st OM ostial 40% stenosis, 50% mid RCA stenosis. Three overlapping DES were placed in the mid LAD (2.75 x 32 mm Promus Premier, 2.5 x 24 mm Promus Premier, 2.25 x 12 mm Promus Premier). LVEF 35% by echo immediately following his MI. Echo 01/28/13 with LVEF 60-65%. Admitted to [facility] January 2015 with sharp chest pain. Cath per Dr. Ellyn Hack February 2015 with stable disease. Patent stents LAD.   He is here today for follow up. The patient denies any chest pain, dyspnea, palpitations, lower extremity edema, orthopnea, PND, dizziness, near syncope or syncope.   Primary Care Physician: Juluis Pitch, MD  Past Medical History:  Diagnosis Date  . CAD (coronary artery disease)    Anterior STEMI 10/21/12, treated with PCI of LAD, 3 overlapping DES mid LAD  . DM (diabetes mellitus) ([place])    Diagnosed 2012  . Hyperlipidemia   . Myocardial infarction ([place])   . Obstructive sleep apnea     Past Surgical History:  Procedure Laterality Date  . CARDIAC CATHETERIZATION    . COLONOSCOPY WITH PROPOFOL N/A 07/24/2015   Procedure: COLONOSCOPY WITH PROPOFOL;  Surgeon: Lollie Sails, MD;  Location: [facility] ENDOSCOPY;  Service: Endoscopy;  Laterality: N/A;  . CORONARY ARTERY BYPASS GRAFT    . ROTATOR CUFF REPAIR  2004  . TONSILLECTOMY      Current Outpatient Medications  Medication Sig Dispense Refill  . atorvastatin (LIPITOR) 20 MG tablet TAKE ONE TABLET DAILY    . carvedilol (COREG) 3.125 MG tablet Take 1 tablet (3.125 mg total) by mouth 2 (two) times daily. 180 tablet 3  . clopidogrel (PLAVIX) 75 MG tablet Take 1  tablet (75 mg total) by mouth daily. 90 tablet 2  . JANUMET XR 50-1000 MG TB24 1 tab twice a day    . lisinopril (PRINIVIL,ZESTRIL) 2.5 MG tablet TAKE ONE TABLET TWICE DAILY    . Multiple Vitamins-Minerals (CENTRUM SILVER ADULT 50+ PO) TAKE ONE TABLET DAILY    . nitroGLYCERIN (NITROSTAT) 0.4 MG SL tablet Place 1 tablet (0.4 mg total) under the tongue every 5 (five) minutes as needed for chest pain. 25 tablet 6  . Omega-3 Fatty Acids (OMEGA-3 FISH OIL PO) Take by mouth. TAKE ONE CAPSULE DAILY    . sitaGLIPtin (JANUVIA) 100 MG tablet Take 100 mg by mouth daily.     No current facility-administered medications for this visit.     No Known Allergies  Social History   Socioeconomic History  . Marital status: Married    Spouse name: Not on file  . Number of children: 2  . Years of education: Not on file  . Highest education level: Not on file  Social Needs  . Financial resource strain: Not on file  . Food insecurity - worry: Not on file  . Food insecurity - inability: Not on file  . Transportation needs - medical: Not on file  . Transportation needs - non-medical: Not on file  Occupational History  . Occupation: Visual merchandiser: [employer]  Tobacco Use  . Smoking status: Former  Smoker  . Smokeless tobacco: Never Used  Substance and Sexual Activity  . Alcohol use: No  . Drug use: No  . Sexual activity: Not on file  Other Topics Concern  . Not on file  Social History Narrative  . Not on file    Family History  Problem Relation Age of Onset  . Heart attack Father   . Stroke Mother   . Stroke Brother     Review of Systems:  As stated in the HPI and otherwise negative.   BP 126/80   Pulse 69   Ht 5\' 10"  (1.778 m)   Wt 236 lb 12.8 oz (107.4 kg)   SpO2 95%   BMI 33.98 kg/m   Physical Examination:  General: Well developed, well nourished, NAD  HEENT: OP clear, mucus membranes moist  SKIN: warm, dry. No rashes. Neuro: No focal deficits  Musculoskeletal:  Muscle strength 5/5 all ext  Psychiatric: Mood and affect normal  Neck: No JVD, no carotid bruits, no thyromegaly, no lymphadenopathy.  Lungs:Clear bilaterally, no wheezes, rhonci, crackles Cardiovascular: Regular rate and rhythm. No murmurs, gallops or rubs. Abdomen:Soft. Bowel sounds present. Non-tender.  Extremities: No lower extremity edema. Pulses are 2 + in the bilateral DP/PT.  Echo 01/27/13: Left ventricle: The cavity size was normal. Wall thickness was increased in a pattern of mild LVH. Systolic function was normal. The estimated ejection fraction was in the range of 60% to 65%. Hypokinesis of the entire myocardium. Left ventricular diastolic function parameters were normal. - Atrial septum: No defect or patent foramen ovale was Identified.  EKG:  EKG is ordered today. The ekg ordered today demonstrates sinus brady, rate 55 bpm.   Recent Labs: No results found for requested labs within last 8760 hours.   Lipid Panel: Followed in primary care   Wt Readings from Last 3 Encounters:  08/10/17 236 lb 12.8 oz (107.4 kg)  06/19/17 240 lb (108.9 kg)  05/23/16 239 lb 9.6 oz (108.7 kg)     Other studies Reviewed: Additional studies/ records that were reviewed today include: . Review of the above records demonstrates:    Assessment and Plan:   1. CAD without angina: He is having no chest pain suggestive of angina. He has 3 stents in his LAD and is known to have moderate disease in his RCA and Circumflex. Last cath February 2015 at [facility]. Will continue statin, Plavix, beta blocker and Ace-inh.     2. Ischemic cardiomyopathy: His LV function normalized. Last LvEF was 60% by echo in 2014. Continue beta blocker and Ace-inh.  Repeat echo now.   3. HLD: His lipids are followed in primary care and controlled per pt. Last LDL 29 in July 2018. Continue statin.   Current medicines are reviewed at length with the patient today.  The patient does not have concerns regarding  medicines.  The following changes have been made:  no change  Labs/ tests ordered today include:   Orders Placed This Encounter  Procedures  . EKG 12-Lead  . ECHOCARDIOGRAM COMPLETE    Disposition:   FU with me in 12  months  Signed, Lauree Chandler, MD 08/10/2017 9:24 AM    [facility] [place], [place], [place]  [address] Phone: [phone]; Fax: [phone]

## 2017-08-10 NOTE — Patient Instructions (Signed)

## 2017-08-18 ENCOUNTER — Ambulatory Visit (HOSPITAL_COMMUNITY): Payer: BLUE CROSS/BLUE SHIELD | Attending: Cardiology

## 2017-08-18 ENCOUNTER — Other Ambulatory Visit: Payer: Self-pay

## 2017-08-18 DIAGNOSIS — I255 Ischemic cardiomyopathy: Secondary | ICD-10-CM | POA: Diagnosis not present

## 2017-08-18 DIAGNOSIS — I252 Old myocardial infarction: Secondary | ICD-10-CM | POA: Diagnosis not present

## 2017-08-18 DIAGNOSIS — I2510 Atherosclerotic heart disease of native coronary artery without angina pectoris: Secondary | ICD-10-CM | POA: Diagnosis not present

## 2017-08-18 DIAGNOSIS — E119 Type 2 diabetes mellitus without complications: Secondary | ICD-10-CM | POA: Insufficient documentation

## 2017-08-18 DIAGNOSIS — E785 Hyperlipidemia, unspecified: Secondary | ICD-10-CM | POA: Insufficient documentation

## 2017-08-18 MED ORDER — PERFLUTREN LIPID MICROSPHERE
1.0000 mL | INTRAVENOUS | Status: AC | PRN
  Administered 2017-08-18: 3 mL via INTRAVENOUS

## 2018-08-05 ENCOUNTER — Ambulatory Visit: Payer: BLUE CROSS/BLUE SHIELD | Admitting: Cardiovascular Disease

## 2018-08-05 ENCOUNTER — Encounter: Payer: Self-pay | Admitting: Cardiovascular Disease

## 2018-08-05 VITALS — BP 116/72 | HR 63 | Ht 70.0 in | Wt 233.0 lb

## 2018-08-05 DIAGNOSIS — E7800 Pure hypercholesterolemia, unspecified: Secondary | ICD-10-CM

## 2018-08-05 DIAGNOSIS — I2510 Atherosclerotic heart disease of native coronary artery without angina pectoris: Secondary | ICD-10-CM | POA: Diagnosis not present

## 2018-08-05 DIAGNOSIS — I255 Ischemic cardiomyopathy: Secondary | ICD-10-CM | POA: Diagnosis not present

## 2018-08-05 NOTE — Patient Instructions (Signed)

## 2018-08-05 NOTE — Progress Notes (Signed)
Chief Complaint  Patient presents with  . Follow-up    CAD   History of Present Illness: 63 yo male with history of CAD with MI March 2014, hyperlipidemia, DM and sleep apnea who is here today for cardiac follow up. He was in [place], [place] on 10/21/12 when he suffered an anterior wall MI. Emergent cardiac cath with 99% mid LAD stenosis, Diagonal 2 with 50% ostial stenosis, 50% mid Circumflex stenosis, 1st OM ostial 40% stenosis, 50% mid RCA stenosis. Three overlapping DES were placed in the mid LAD (2.75 x 32 mm Promus Premier, 2.5 x 24 mm Promus Premier, 2.25 x 12 mm Promus Premier). LVEF 35% by echo immediately following his MI but LV systolic function was normal by echo in July 2014. He was admitted to [facility] January 2015 with sharp chest pain. Cath per Dr. Ellyn Hack February 2015 with stable disease. Patent stents LAD. Echo January 2019 with LVEF=45-50%.   He is here today for follow up. The patient denies any chest pain, dyspnea, palpitations, lower extremity edema, orthopnea, PND, dizziness, near syncope or syncope.   Primary Care Physician: Juluis Pitch, MD  Past Medical History:  Diagnosis Date  . CAD (coronary artery disease)    Anterior STEMI 10/21/12, treated with PCI of LAD, 3 overlapping DES mid LAD  . DM (diabetes mellitus) ([place])    Diagnosed 2012  . Hyperlipidemia   . Myocardial infarction ([place])   . Obstructive sleep apnea     Past Surgical History:  Procedure Laterality Date  . CARDIAC CATHETERIZATION    . COLONOSCOPY WITH PROPOFOL N/A 07/24/2015   Procedure: COLONOSCOPY WITH PROPOFOL;  Surgeon: Lollie Sails, MD;  Location: [facility] ENDOSCOPY;  Service: Endoscopy;  Laterality: N/A;  . CORONARY ARTERY BYPASS GRAFT    . ROTATOR CUFF REPAIR  2004  . TONSILLECTOMY      Current Outpatient Medications  Medication Sig Dispense Refill  . atorvastatin (LIPITOR) 20 MG tablet TAKE ONE TABLET DAILY    . carvedilol (COREG) 3.125 MG tablet Take 1 tablet (3.125 mg total) by  mouth 2 (two) times daily. 180 tablet 3  . clopidogrel (PLAVIX) 75 MG tablet Take 1 tablet (75 mg total) by mouth daily. 90 tablet 2  . JANUMET XR 50-1000 MG TB24 1 tab twice a day    . lisinopril (PRINIVIL,ZESTRIL) 2.5 MG tablet TAKE ONE TABLET TWICE DAILY    . Multiple Vitamins-Minerals (CENTRUM SILVER ADULT 50+ PO) TAKE ONE TABLET DAILY    . nitroGLYCERIN (NITROSTAT) 0.4 MG SL tablet Place 1 tablet (0.4 mg total) under the tongue every 5 (five) minutes as needed for chest pain. 25 tablet 6  . Omega-3 Fatty Acids (OMEGA-3 FISH OIL PO) Take by mouth. TAKE ONE CAPSULE DAILY     No current facility-administered medications for this visit.     No Known Allergies  Social History   Socioeconomic History  . Marital status: Married    Spouse name: Not on file  . Number of children: 2  . Years of education: Not on file  . Highest education level: Not on file  Occupational History  . Occupation: Visual merchandiser: [employer]  Social Needs  . Financial resource strain: Not on file  . Food insecurity:    Worry: Not on file    Inability: Not on file  . Transportation needs:    Medical: Not on file    Non-medical: Not on file  Tobacco Use  . Smoking status: Former Research scientist (life sciences)  .  Smokeless tobacco: Never Used  Substance and Sexual Activity  . Alcohol use: No  . Drug use: No  . Sexual activity: Not on file  Lifestyle  . Physical activity:    Days per week: Not on file    Minutes per session: Not on file  . Stress: Not on file  Relationships  . Social connections:    Talks on phone: Not on file    Gets together: Not on file    Attends religious service: Not on file    Active member of club or organization: Not on file    Attends meetings of clubs or organizations: Not on file    Relationship status: Not on file  . Intimate partner violence:    Fear of current or ex partner: Not on file    Emotionally abused: Not on file    Physically abused: Not on file    Forced sexual  activity: Not on file  Other Topics Concern  . Not on file  Social History Narrative  . Not on file    Family History  Problem Relation Age of Onset  . Heart attack Father   . Stroke Mother   . Stroke Brother     Review of Systems:  As stated in the HPI and otherwise negative.   BP 116/72   Pulse 63   Ht 5\' 10"  (1.778 m)   Wt 233 lb (105.7 kg)   SpO2 96%   BMI 33.43 kg/m   Physical Examination: General: Well developed, well nourished, NAD  HEENT: OP clear, mucus membranes moist  SKIN: warm, dry. No rashes. Neuro: No focal deficits  Musculoskeletal: Muscle strength 5/5 all ext  Psychiatric: Mood and affect normal  Neck: No JVD, no carotid bruits, no thyromegaly, no lymphadenopathy.  Lungs:Clear bilaterally, no wheezes, rhonci, crackles Cardiovascular: Regular rate and rhythm. No murmurs, gallops or rubs. Abdomen:Soft. Bowel sounds present. Non-tender.  Extremities: No lower extremity edema. Pulses are 2 + in the bilateral DP/PT.  Echo 08/18/17: - Left ventricle: The cavity size was normal. Wall thickness was   normal. Systolic function was mildly reduced. The estimated   ejection fraction was in the range of 45% to 50%. There is   hypokinesis of the anterior myocardium. Doppler parameters are   consistent with abnormal left ventricular relaxation (grade 1   diastolic dysfunction).  EKG:  EKG is ordered today. The ekg ordered today demonstrates NSR, rate 63 bpm.   Recent Labs: No results found for requested labs within last 8760 hours.    Lipid Panel: Followed in primary care   Wt Readings from Last 3 Encounters:  08/05/18 233 lb (105.7 kg)  08/10/17 236 lb 12.8 oz (107.4 kg)  06/19/17 240 lb (108.9 kg)     Other studies Reviewed: Additional studies/ records that were reviewed today include: . Review of the above records demonstrates:    Assessment and Plan:   1. CAD without angina: He has 3 stents in his LAD and is known to have moderate disease in his  RCA and Circumflex. Last cath February 2015 at [facility] with stable CAD. He has no chest pain. Continue Plavix, statin and beta blocker.      2. Ischemic cardiomyopathy: Echo January 2019 with LVEF 45-50%. Will continue beta blocker and Ace-inh.    3. HLD: His lipids are followed in primary care and have been well controlled. Continue statin.    Current medicines are reviewed at length with the patient today.  The patient does  not have concerns regarding medicines.  The following changes have been made:  no change  Labs/ tests ordered today include:   Orders Placed This Encounter  Procedures  . EKG 12-Lead    Disposition:   FU with me in 12  months  Signed, Lauree Chandler, MD 08/05/2018 9:10 AM    [facility] [place], [place], [place]  [address] Phone: [phone]; Fax: [phone]

## 2018-09-15 ENCOUNTER — Other Ambulatory Visit: Payer: Self-pay | Admitting: Cardiovascular Disease

## 2018-09-15 MED ORDER — NITROGLYCERIN 0.4 MG SL SUBL: 0.4000 mg | SUBLINGUAL_TABLET | SUBLINGUAL | 2 refills | Status: AC | PRN

## 2018-09-15 NOTE — Telephone Encounter (Signed)
Pt's medication was sent to pt's pharmacy as requested. Confirmation received.  °

## 2018-09-15 NOTE — Telephone Encounter (Signed)
New Message    *STAT* If patient is at the pharmacy, call can be transferred to refill team.   1. Which medications need to be refilled? (please list name of each medication and dose if known) nitroGLYCERIN (NITROSTAT) 0.4 MG SL tablet   2. Which pharmacy/location (including street and city if local pharmacy) is medication to be sent to? [id number] - [place], [place] - [address]  3. Do they need a 30 day or 90 day supply? [place]

## 2019-04-12 ENCOUNTER — Other Ambulatory Visit: Payer: Self-pay | Admitting: *Deleted

## 2019-04-12 DIAGNOSIS — Z20822 Contact with and (suspected) exposure to covid-19: Secondary | ICD-10-CM

## 2019-04-14 LAB — NOVEL CORONAVIRUS, NAA: SARS-CoV-2, NAA: NOT DETECTED

## 2019-09-06 NOTE — Progress Notes (Signed)
Chief Complaint  Patient presents with  . Follow-up    cad   History of Present Illness: 65 yo male with history of CAD with MI March 2014, hyperlipidemia, DM and sleep apnea who is here today for cardiac follow up. He was in [place], [place] on 10/21/12 when he suffered an anterior wall MI. Emergent cardiac cath with 99% mid LAD stenosis, Diagonal 2 with 50% ostial stenosis, 50% mid Circumflex stenosis, 1st OM ostial 40% stenosis, 50% mid RCA stenosis. Three overlapping DES were placed in the mid LAD (2.75 x 32 mm Promus Premier, 2.5 x 24 mm Promus Premier, 2.25 x 12 mm Promus Premier). LVEF 35% by echo immediately following his MI but LV systolic function was normal by echo in July 2014. He was admitted to [facility] January 2015 with sharp chest pain. Cath per Dr. Ellyn Hack February 2015 with stable disease. Patent stents LAD. Echo January 2019 with LVEF=45-50%.   He is here today for follow up. The patient denies any chest pain, dyspnea, palpitations, lower extremity edema, orthopnea, PND, dizziness, near syncope or syncope.   Primary Care Physician: Juluis Pitch, MD  Past Medical History:  Diagnosis Date  . CAD (coronary artery disease)    Anterior STEMI 10/21/12, treated with PCI of LAD, 3 overlapping DES mid LAD  . DM (diabetes mellitus) ([place])    Diagnosed 2012  . Hyperlipidemia   . Myocardial infarction ([place])   . Obstructive sleep apnea     Past Surgical History:  Procedure Laterality Date  . CARDIAC CATHETERIZATION    . COLONOSCOPY WITH PROPOFOL N/A 07/24/2015   Procedure: COLONOSCOPY WITH PROPOFOL;  Surgeon: Lollie Sails, MD;  Location: [facility] ENDOSCOPY;  Service: Endoscopy;  Laterality: N/A;  . CORONARY ARTERY BYPASS GRAFT    . ROTATOR CUFF REPAIR  2004  . TONSILLECTOMY      Current Outpatient Medications  Medication Sig Dispense Refill  . atorvastatin (LIPITOR) 20 MG tablet TAKE ONE TABLET DAILY    . carvedilol (COREG) 3.125 MG tablet Take 1 tablet (3.125 mg total) by  mouth 2 (two) times daily. 180 tablet 3  . clopidogrel (PLAVIX) 75 MG tablet Take 1 tablet (75 mg total) by mouth daily. 90 tablet 2  . fluticasone (FLONASE) 50 MCG/ACT nasal spray USE 2 SPRAYS IN EACH NOSTRIL ONCE DAILY AS NEEDED FOR RHINITIS OR ALLERGIES    . JANUMET XR 50-1000 MG TB24 1 tab twice a day    . lisinopril (PRINIVIL,ZESTRIL) 2.5 MG tablet TAKE ONE TABLET TWICE DAILY    . Multiple Vitamins-Minerals (CENTRUM SILVER ADULT 50+ PO) TAKE ONE TABLET DAILY    . nitroGLYCERIN (NITROSTAT) 0.4 MG SL tablet Place 1 tablet (0.4 mg total) under the tongue every 5 (five) minutes as needed for chest pain. 75 tablet 2  . Omega-3 Fatty Acids (OMEGA-3 FISH OIL PO) Take by mouth. TAKE ONE CAPSULE DAILY    . sildenafil (REVATIO) 20 MG tablet TAKE 1-5 TABS 30-60 MIN BEFORE ACTIVITY     No current facility-administered medications for this visit.    No Known Allergies  Social History   Socioeconomic History  . Marital status: Married    Spouse name: Not on file  . Number of children: 2  . Years of education: Not on file  . Highest education level: Not on file  Occupational History  . Occupation: Visual merchandiser: [employer]  Tobacco Use  . Smoking status: Former Research scientist (life sciences)  . Smokeless tobacco: Never Used  Substance and  Sexual Activity  . Alcohol use: No  . Drug use: No  . Sexual activity: Not on file  Other Topics Concern  . Not on file  Social History Narrative  . Not on file   Social Determinants of Health   Financial Resource Strain:   . Difficulty of Paying Living Expenses: Not on file  Food Insecurity:   . Worried About Charity fundraiser in the Last Year: Not on file  . Ran Out of Food in the Last Year: Not on file  Transportation Needs:   . Lack of Transportation (Medical): Not on file  . Lack of Transportation (Non-Medical): Not on file  Physical Activity:   . Days of Exercise per Week: Not on file  . Minutes of Exercise per Session: Not on file  Stress:   .  Feeling of Stress : Not on file  Social Connections:   . Frequency of Communication with Friends and Family: Not on file  . Frequency of Social Gatherings with Friends and Family: Not on file  . Attends Religious Services: Not on file  . Active Member of Clubs or Organizations: Not on file  . Attends Archivist Meetings: Not on file  . Marital Status: Not on file  Intimate Partner Violence:   . Fear of Current or Ex-Partner: Not on file  . Emotionally Abused: Not on file  . Physically Abused: Not on file  . Sexually Abused: Not on file    Family History  Problem Relation Age of Onset  . Heart attack Father   . Stroke Mother   . Stroke Brother     Review of Systems:  As stated in the HPI and otherwise negative.   BP 120/80   Pulse 63   Ht 5\' 10"  (1.778 m)   Wt 232 lb 1.9 oz (105.3 kg)   SpO2 97%   BMI 33.31 kg/m   Physical Examination: General: Well developed, well nourished, NAD  HEENT: OP clear, mucus membranes moist  SKIN: warm, dry. No rashes. Neuro: No focal deficits  Musculoskeletal: Muscle strength 5/5 all ext  Psychiatric: Mood and affect normal  Neck: No JVD, no carotid bruits, no thyromegaly, no lymphadenopathy.  Lungs:Clear bilaterally, no wheezes, rhonci, crackles Cardiovascular: Regular rate and rhythm. No murmurs, gallops or rubs. Abdomen:Soft. Bowel sounds present. Non-tender.  Extremities: No lower extremity edema. Pulses are 2 + in the bilateral DP/PT.  Echo 08/18/17: - Left ventricle: The cavity size was normal. Wall thickness was   normal. Systolic function was mildly reduced. The estimated   ejection fraction was in the range of 45% to 50%. There is   hypokinesis of the anterior myocardium. Doppler parameters are   consistent with abnormal left ventricular relaxation (grade 1   diastolic dysfunction).  EKG:  EKG is ordered today. The ekg ordered today demonstrates NSR, rate 63 bpm.   Recent Labs: No results found for requested labs  within last 8760 hours.    Lipid Panel: Followed in primary care   Wt Readings from Last 3 Encounters:  09/08/19 232 lb 1.9 oz (105.3 kg)  08/05/18 233 lb (105.7 kg)  08/10/17 236 lb 12.8 oz (107.4 kg)     Other studies Reviewed: Additional studies/ records that were reviewed today include: . Review of the above records demonstrates:   Assessment and Plan:   1. CAD without angina: He has 3 stents in his LAD and is known to have moderate disease in his RCA and Circumflex. Last cath February  2015 at [facility] with stable CAD. No chest pain suggestive of angina. Will continue Plavix, beta blocker and statin.       2. Ischemic cardiomyopathy: Echo January 2019 with LVEF 45-50%. Continue Ace-inh and beta blocker  3. HLD: His lipids are followed in primary care. LDL not on KPN form. Continue statin.     Current medicines are reviewed at length with the patient today.  The patient does not have concerns regarding medicines.  The following changes have been made:  no change  Labs/ tests ordered today include:   Orders Placed This Encounter  Procedures  . EKG 12-Lead    Disposition:   FU with me in 12  months  Signed, Lauree Chandler, MD 09/08/2019 9:10 AM    [facility] [place], [place], [place]  [address] Phone: [phone]; Fax: [phone]

## 2019-09-08 ENCOUNTER — Encounter: Payer: Self-pay | Admitting: Cardiovascular Disease

## 2019-09-08 ENCOUNTER — Ambulatory Visit: Payer: Self-pay | Admitting: Cardiovascular Disease

## 2019-09-08 ENCOUNTER — Other Ambulatory Visit: Payer: Self-pay

## 2019-09-08 VITALS — BP 120/80 | HR 63 | Ht 70.0 in | Wt 232.1 lb

## 2019-09-08 DIAGNOSIS — I255 Ischemic cardiomyopathy: Secondary | ICD-10-CM

## 2019-09-08 DIAGNOSIS — I2510 Atherosclerotic heart disease of native coronary artery without angina pectoris: Secondary | ICD-10-CM

## 2019-09-08 DIAGNOSIS — E7800 Pure hypercholesterolemia, unspecified: Secondary | ICD-10-CM

## 2019-09-08 NOTE — Patient Instructions (Signed)

## 2020-07-03 ENCOUNTER — Ambulatory Visit: Payer: Medicare Other | Attending: Neurology

## 2020-07-03 DIAGNOSIS — G4733 Obstructive sleep apnea (adult) (pediatric): Secondary | ICD-10-CM | POA: Diagnosis not present

## 2020-07-03 DIAGNOSIS — G4761 Periodic limb movement disorder: Secondary | ICD-10-CM | POA: Diagnosis not present

## 2020-07-05 ENCOUNTER — Other Ambulatory Visit: Payer: Self-pay

## 2021-01-21 ENCOUNTER — Other Ambulatory Visit: Payer: Self-pay

## 2021-01-21 ENCOUNTER — Encounter: Payer: Self-pay | Admitting: Cardiovascular Disease

## 2021-01-21 ENCOUNTER — Ambulatory Visit (INDEPENDENT_AMBULATORY_CARE_PROVIDER_SITE_OTHER): Payer: Medicare Other | Admitting: Cardiovascular Disease

## 2021-01-21 VITALS — BP 116/68 | HR 62 | Ht 70.0 in | Wt 224.2 lb

## 2021-01-21 DIAGNOSIS — I255 Ischemic cardiomyopathy: Secondary | ICD-10-CM

## 2021-01-21 DIAGNOSIS — E7800 Pure hypercholesterolemia, unspecified: Secondary | ICD-10-CM

## 2021-01-21 DIAGNOSIS — I2510 Atherosclerotic heart disease of native coronary artery without angina pectoris: Secondary | ICD-10-CM | POA: Diagnosis not present

## 2021-01-21 NOTE — Patient Instructions (Signed)
Medication Instructions:  Your physician recommends that you continue on your current medications as directed. Please refer to the Current Medication list given to you today.  *If you need a refill on your cardiac medications before your next appointment, please call your pharmacy*   Lab Work: None If you have labs (blood work) drawn today and your tests are completely normal, you will receive your results only by: [place] (if you have MyChart) OR A paper copy in the mail If you have any lab test that is abnormal or we need to change your treatment, we will call you to review the results.   Testing/Procedures: Your physician has requested that you have an exercise tolerance test. For further information please visit [email or web address]. Please also follow instruction sheet, as given.   Follow-Up: At [facility], you and your health needs are our priority.  As part of our continuing mission to provide you with exceptional heart care, we have created designated Provider Care Teams.  These Care Teams include your primary Cardiologist (physician) and Advanced Practice Providers (APPs -  Physician Assistants and Nurse Practitioners) who all work together to provide you with the care you need, when you need it.  We recommend signing up for the patient portal called "MyChart".  Sign up information is provided on this After Visit Summary.  MyChart is used to connect with patients for Virtual Visits (Telemedicine).  Patients are able to view lab/test results, encounter notes, upcoming appointments, etc.  Non-urgent messages can be sent to your provider as well.   To learn more about what you can do with MyChart, go to [email or web address].    Your next appointment:   1 year(s)  The format for your next appointment:   In Person  Provider:   You may see Lauree Chandler, MD or one of the following Advanced Practice Providers on your designated Care Team:   Melina Copa,  PA-C Ermalinda Barrios, PA-C   Other Instructions

## 2021-01-21 NOTE — Progress Notes (Signed)
Chief Complaint  Patient presents with   Follow-up    CAD    History of Present Illness: 65 yo male with history of CAD with MI March 2014, hyperlipidemia, DM and sleep apnea who is here today for cardiac follow up. He was in [place], [place] on 10/21/12 when he suffered an anterior wall MI. Emergent cardiac cath with 99% mid LAD stenosis, Diagonal 2 with 50% ostial stenosis, 50% mid Circumflex stenosis, 1st OM ostial 40% stenosis, 50% mid RCA stenosis. Three overlapping DES were placed in the mid LAD (2.75 x 32 mm Promus Premier, 2.5 x 24 mm Promus Premier, 2.25 x 12 mm Promus Premier). LVEF 35% by echo immediately following his MI but LV systolic function was normal by echo in July 2014. He was admitted to [facility] January 2015 with sharp chest pain. Cath per Dr. Ellyn Hack February 2015 with stable disease. Patent stents LAD. Echo January 2019 with LVEF=45-50%.   He is here today for follow up. The patient denies any chest pain, dyspnea, palpitations, lower extremity edema, orthopnea, PND, dizziness, near syncope or syncope.     Primary Care Physician: Juluis Pitch, MD  Past Medical History:  Diagnosis Date   CAD (coronary artery disease)    Anterior STEMI 10/21/12, treated with PCI of LAD, 3 overlapping DES mid LAD   DM (diabetes mellitus) ([place])    Diagnosed 2012   Hyperlipidemia    Myocardial infarction ([place])    Obstructive sleep apnea     Past Surgical History:  Procedure Laterality Date   CARDIAC CATHETERIZATION     COLONOSCOPY WITH PROPOFOL N/A 07/24/2015   Procedure: COLONOSCOPY WITH PROPOFOL;  Surgeon: Lollie Sails, MD;  Location: [facility] ENDOSCOPY;  Service: Endoscopy;  Laterality: N/A;   CORONARY ARTERY BYPASS GRAFT     ROTATOR CUFF REPAIR  2004   TONSILLECTOMY      Current Outpatient Medications  Medication Sig Dispense Refill   atorvastatin (LIPITOR) 20 MG tablet TAKE ONE TABLET DAILY     carvedilol (COREG) 3.125 MG tablet Take 1 tablet (3.125 mg total) by mouth 2  (two) times daily. 180 tablet 3   clopidogrel (PLAVIX) 75 MG tablet Take 1 tablet (75 mg total) by mouth daily. 90 tablet 2   Dulaglutide (TRULICITY) 1.60 FU/9.3AT SOPN INJECT 0.5ML (=0.75 MG)    SUBCUTANEOUSLY ONCE WEEKLY     fluticasone (FLONASE) 50 MCG/ACT nasal spray USE 2 SPRAYS IN EACH NOSTRIL ONCE DAILY AS NEEDED FOR RHINITIS OR ALLERGIES     JANUMET XR 50-1000 MG TB24 1 tab twice a day     lisinopril (PRINIVIL,ZESTRIL) 2.5 MG tablet TAKE ONE TABLET TWICE DAILY     Multiple Vitamins-Minerals (CENTRUM SILVER ADULT 50+ PO) TAKE ONE TABLET DAILY     nitroGLYCERIN (NITROSTAT) 0.4 MG SL tablet Place 1 tablet (0.4 mg total) under the tongue every 5 (five) minutes as needed for chest pain. 75 tablet 2   Omega-3 Fatty Acids (OMEGA-3 FISH OIL PO) Take by mouth. TAKE ONE CAPSULE DAILY     sildenafil (REVATIO) 20 MG tablet TAKE 1-5 TABS 30-60 MIN BEFORE ACTIVITY     No current facility-administered medications for this visit.    No Known Allergies  Social History   Socioeconomic History   Marital status: Married    Spouse name: Not on file   Number of children: 2   Years of education: Not on file   Highest education level: Not on file  Occupational History   Occupation: Engineer, mining  Employer: [employer]  Tobacco Use   Smoking status: Former    Pack years: 0.00   Smokeless tobacco: Never  Vaping Use   Vaping Use: Never used  Substance and Sexual Activity   Alcohol use: No   Drug use: No   Sexual activity: Not on file  Other Topics Concern   Not on file  Social History Narrative   Not on file   Social Determinants of Health   Financial Resource Strain: Not on file  Food Insecurity: Not on file  Transportation Needs: Not on file  Physical Activity: Not on file  Stress: Not on file  Social Connections: Not on file  Intimate Partner Violence: Not on file    Family History  Problem Relation Age of Onset   Heart attack Father    Stroke Mother    Stroke Brother      Review of Systems:  As stated in the HPI and otherwise negative.   BP 116/68   Pulse 62   Ht 5\' 10"  (1.778 m)   Wt 224 lb 3.2 oz (101.7 kg)   SpO2 97%   BMI 32.17 kg/m   Physical Examination: General: Well developed, well nourished, NAD  HEENT: OP clear, mucus membranes moist  SKIN: warm, dry. No rashes. Neuro: No focal deficits  Musculoskeletal: Muscle strength 5/5 all ext  Psychiatric: Mood and affect normal  Neck: No JVD, no carotid bruits, no thyromegaly, no lymphadenopathy.  Lungs:Clear bilaterally, no wheezes, rhonci, crackles Cardiovascular: Regular rate and rhythm. No murmurs, gallops or rubs. Abdomen:Soft. Bowel sounds present. Non-tender.  Extremities: No lower extremity edema. Pulses are 2 + in the bilateral DP/PT.  Echo 08/18/17: - Left ventricle: The cavity size was normal. Wall thickness was   normal. Systolic function was mildly reduced. The estimated   ejection fraction was in the range of 45% to 50%. There is   hypokinesis of the anterior myocardium. Doppler parameters are   consistent with abnormal left ventricular relaxation (grade 1   diastolic dysfunction).  EKG:  EKG is ordered today. The ekg ordered today demonstrates Sinus  Recent Labs: No results found for requested labs within last 8760 hours.    Lipid Panel: Followed in primary care   Wt Readings from Last 3 Encounters:  01/21/21 224 lb 3.2 oz (101.7 kg)  09/08/19 232 lb 1.9 oz (105.3 kg)  08/05/18 233 lb (105.7 kg)     Other studies Reviewed: Additional studies/ records that were reviewed today include: . Review of the above records demonstrates:   Assessment and Plan:   1. CAD without angina: He has 3 stents in his LAD and is known to have moderate disease in his RCA and Circumflex. Last cath February 2015 at [facility] with stable CAD. He has no chst pain concerning for angina. Continue beta blocker, statin and Plavix.   Will arrange an exercise stress test. He has had no ischemic  testing in years.   2. Ischemic cardiomyopathy: Echo January 2019 with LVEF 45-50%. Will continue Ace-inhibitor and beta blocker.   3. HLD: His lipids are followed in primary care. LDL is not on the KPN form. Will continue the statin.      Current medicines are reviewed at length with the patient today.  The patient does not have concerns regarding medicines.  The following changes have been made:  no change  Labs/ tests ordered today include:   Orders Placed This Encounter  Procedures   Exercise Tolerance Test   EKG 12-Lead  Disposition:   F/U with me in 12  months  Signed, Lauree Chandler, MD 01/21/2021 2:35 PM    [facility] [place], [place], [place]  [address] Phone: [phone]; Fax: [phone]

## 2021-01-24 ENCOUNTER — Other Ambulatory Visit: Payer: Self-pay

## 2021-01-24 ENCOUNTER — Ambulatory Visit (INDEPENDENT_AMBULATORY_CARE_PROVIDER_SITE_OTHER): Payer: Medicare Other

## 2021-01-24 DIAGNOSIS — I2510 Atherosclerotic heart disease of native coronary artery without angina pectoris: Secondary | ICD-10-CM

## 2021-01-24 LAB — EXERCISE TOLERANCE TEST
Estimated workload: 10.2 METS
Exercise duration (min): 9 min
Exercise duration (sec): 13 s
MPHR: 154 {beats}/min
Peak HR: 142 {beats}/min
Percent HR: 92 %
RPE: 16
Rest HR: 62 {beats}/min

## 2021-07-23 ENCOUNTER — Encounter: Payer: Self-pay | Admitting: Internal Medicine

## 2021-07-24 ENCOUNTER — Ambulatory Visit: Payer: Medicare Other | Admitting: Anesthesiology

## 2021-07-24 ENCOUNTER — Ambulatory Visit
Admission: RE | Admit: 2021-07-24 | Discharge: 2021-07-24 | Disposition: A | Discharge status: Home / Self Care | Payer: Medicare Other | Attending: Internal Medicine | Admitting: Internal Medicine

## 2021-07-24 ENCOUNTER — Encounter: Payer: Self-pay | Admitting: Internal Medicine

## 2021-07-24 ENCOUNTER — Encounter
Admission: RE | Disposition: A | Discharge status: Home / Self Care | Payer: Self-pay | Source: Home / Self Care | Attending: Internal Medicine

## 2021-07-24 DIAGNOSIS — E119 Type 2 diabetes mellitus without complications: Secondary | ICD-10-CM | POA: Insufficient documentation

## 2021-07-24 DIAGNOSIS — K640 First degree hemorrhoids: Secondary | ICD-10-CM | POA: Insufficient documentation

## 2021-07-24 DIAGNOSIS — Z1211 Encounter for screening for malignant neoplasm of colon: Secondary | ICD-10-CM | POA: Diagnosis present

## 2021-07-24 DIAGNOSIS — K5730 Diverticulosis of large intestine without perforation or abscess without bleeding: Secondary | ICD-10-CM | POA: Diagnosis not present

## 2021-07-24 HISTORY — PX: COLONOSCOPY WITH PROPOFOL: SHX5780

## 2021-07-24 HISTORY — DX: Essential (primary) hypertension: I10

## 2021-07-24 LAB — GLUCOSE, CAPILLARY: Glucose-Capillary: 144 mg/dL — ABNORMAL HIGH (ref 70–99)

## 2021-07-24 SURGERY — COLONOSCOPY WITH PROPOFOL
Anesthesia: General

## 2021-07-24 MED ORDER — PROPOFOL 500 MG/50ML IV EMUL
INTRAVENOUS | Status: DC | PRN
  Administered 2021-07-24: 150 ug/kg/min via INTRAVENOUS

## 2021-07-24 MED ORDER — SODIUM CHLORIDE 0.9 % IV SOLN
INTRAVENOUS | Status: DC
  Administered 2021-07-24: 08:00:00 1000 mL via INTRAVENOUS

## 2021-07-24 MED ORDER — PROPOFOL 500 MG/50ML IV EMUL
INTRAVENOUS | Status: AC
  Filled 2021-07-24: qty 50

## 2021-07-24 NOTE — H&P (Signed)
Outpatient short stay form Pre-procedure 07/24/2021 8:27 AM Justin Mckinney K. Alice Reichert, M.D.  Primary Physician: Juluis Pitch, M.D.  Reason for visit:  Personal history of adenomatous colon polyps c. 2017.  History of present illness:                            Patient presents for colonoscopy for a personal hx of colon polyps. The patient denies abdominal pain, abnormal weight loss or rectal bleeding.      Current Facility-Administered Medications:    0.9 %  sodium chloride infusion, , Intravenous, Continuous, Martena Emanuele, Benay Pike, MD, Last Rate: 20 mL/hr at 07/24/21 0745, 1,000 mL at 07/24/21 0745  Medications Prior to Admission  Medication Sig Dispense Refill Last Dose   atorvastatin (LIPITOR) 20 MG tablet TAKE ONE TABLET DAILY   Past Week   carvedilol (COREG) 3.125 MG tablet Take 1 tablet (3.125 mg total) by mouth 2 (two) times daily. 180 tablet 3 07/23/2021   Dulaglutide (TRULICITY) 1.96 QI/2.9NL SOPN INJECT 0.5ML (=0.75 MG)    SUBCUTANEOUSLY ONCE WEEKLY   Past Week   fluticasone (FLONASE) 50 MCG/ACT nasal spray USE 2 SPRAYS IN EACH NOSTRIL ONCE DAILY AS NEEDED FOR RHINITIS OR ALLERGIES   Past Week   JANUMET XR 50-1000 MG TB24 1 tab twice a day   Past Week   lisinopril (PRINIVIL,ZESTRIL) 2.5 MG tablet TAKE ONE TABLET TWICE DAILY   07/23/2021   Multiple Vitamins-Minerals (CENTRUM SILVER ADULT 50+ PO) TAKE ONE TABLET DAILY   Past Week   Omega-3 Fatty Acids (OMEGA-3 FISH OIL PO) Take by mouth. TAKE ONE CAPSULE DAILY   Past Week   clopidogrel (PLAVIX) 75 MG tablet Take 1 tablet (75 mg total) by mouth daily. 90 tablet 2 07/19/2021   nitroGLYCERIN (NITROSTAT) 0.4 MG SL tablet Place 1 tablet (0.4 mg total) under the tongue every 5 (five) minutes as needed for chest pain. 75 tablet 2  at prn   sildenafil (REVATIO) 20 MG tablet TAKE 1-5 TABS 30-60 MIN BEFORE ACTIVITY        No Known Allergies   Past Medical History:  Diagnosis Date   CAD (coronary artery disease)    Anterior STEMI 10/21/12,  treated with PCI of LAD, 3 overlapping DES mid LAD   DM (diabetes mellitus) ([place])    Diagnosed 2012   Hyperlipidemia    Hypertension    Myocardial infarction ([place])    Obstructive sleep apnea     Review of systems:  Otherwise negative.    Physical Exam  Gen: Alert, oriented. Appears stated age.  HEENT: [place]/AT. PERRLA. Lungs: CTA, no wheezes. CV: RR nl S1, S2. Abd: soft, benign, no masses. BS+ Ext: No edema. Pulses 2+    Planned procedures: Proceed with colonoscopy. The patient understands the nature of the planned procedure, indications, risks, alternatives and potential complications including but not limited to bleeding, infection, perforation, damage to internal organs and possible oversedation/side effects from anesthesia. The patient agrees and gives consent to proceed.  Please refer to procedure notes for findings, recommendations and patient disposition/instructions.     [name] K. Alice Reichert, M.D. Gastroenterology 07/24/2021  8:27 AM

## 2021-07-24 NOTE — Anesthesia Preprocedure Evaluation (Signed)
Anesthesia Evaluation  Patient identified by MRN, date of birth, ID band Patient awake    Reviewed: Allergy & Precautions, NPO status , Patient's Chart, lab work & pertinent test results, reviewed documented beta blocker date and time   Airway Mallampati: II  TM Distance: >3 FB Neck ROM: Full    Dental no notable dental hx.    Pulmonary sleep apnea and Continuous Positive Airway Pressure Ventilation , former smoker,    Pulmonary exam normal        Cardiovascular hypertension, Pt. on medications and Pt. on home beta blockers + CAD, + Past MI and + Cardiac Stents  Normal cardiovascular exam     Neuro/Psych negative neurological ROS  negative psych ROS   GI/Hepatic negative GI ROS, Neg liver ROS, Bowel prep,  Endo/Other  negative endocrine ROSdiabetes  Renal/GU negative Renal ROS  negative genitourinary   Musculoskeletal negative musculoskeletal ROS (+)   Abdominal   Peds negative pediatric ROS (+)  Hematology negative hematology ROS (+)   Anesthesia Other Findings CAD (coronary artery disease)  Anterior STEMI 10/21/12, treated with PCI of LAD, 3 overlapping DES mid LAD DM (diabetes mellitus) ([place])  Diagnosed 2012  Hyperlipidemia    Hypertension    Myocardial infarction ([place])    Obstructive sleep apnea       Reproductive/Obstetrics negative OB ROS                             Anesthesia Physical Anesthesia Plan  ASA: 3  Anesthesia Plan: General   Post-op Pain Management:    Induction: Intravenous  PONV Risk Score and Plan: 2 and Propofol infusion and TIVA  Airway Management Planned: Natural Airway and Nasal Cannula  Additional Equipment:   Intra-op Plan:   Post-operative Plan:   Informed Consent:   Plan Discussed with: CRNA, Anesthesiologist and Surgeon  Anesthesia Plan Comments:         Anesthesia Quick Evaluation

## 2021-07-24 NOTE — Anesthesia Postprocedure Evaluation (Signed)
Anesthesia Post Note  Patient: Justin Mckinney  Procedure(s) Performed: COLONOSCOPY WITH PROPOFOL  Patient location during evaluation: Phase II Anesthesia Type: General Level of consciousness: awake and alert, awake and oriented Pain management: pain level controlled Vital Signs Assessment: post-procedure vital signs reviewed and stable Respiratory status: spontaneous breathing, nonlabored ventilation and respiratory function stable Cardiovascular status: blood pressure returned to baseline and stable Postop Assessment: no apparent nausea or vomiting Anesthetic complications: no   No notable events documented.   Last Vitals:  Vitals:   07/24/21 0856 07/24/21 0906  BP: 108/82 115/80  Pulse: 81 77  Resp: 13 14  Temp:    SpO2: 95% 96%    Last Pain:  Vitals:   07/24/21 0906  TempSrc:   PainSc: 0-No pain                 Phill Mutter

## 2021-07-24 NOTE — Transfer of Care (Signed)
Immediate Anesthesia Transfer of Care Note  Patient: Justin Mckinney  Procedure(s) Performed: COLONOSCOPY WITH PROPOFOL  Patient Location: PACU  Anesthesia Type:General  Level of Consciousness: awake and sedated  Airway & Oxygen Therapy: Patient Spontanous Breathing and Patient connected to face mask oxygen  Post-op Assessment: Report given to RN and Post -op Vital signs reviewed and stable  Post vital signs: Reviewed and stable  Last Vitals:  Vitals Value Taken Time  BP    Temp    Pulse    Resp    SpO2      Last Pain:  Vitals:   07/24/21 0730  TempSrc: Temporal         Complications: No notable events documented.

## 2021-07-24 NOTE — Op Note (Signed)
[facility] Gastroenterology Patient Name: Justin Mckinney Procedure Date: 07/24/2021 7:31 AM MRN: [id number] Account #: [id number] Date of Birth: 11/25/1955 Admit Type: Outpatient Age: 65 Room: [facility] ENDO ROOM 2 Gender: Male Note Status: Finalized Instrument Name: Jasper Riling [id number] Procedure:             Colonoscopy Indications:           Surveillance: Personal history of adenomatous polyps                         on last colonoscopy > 5 years ago Providers:             Lorie Apley K. Alice Reichert MD, MD Referring MD:          Youlanda Roys. Lovie Macadamia, MD (Referring MD) Medicines:             Propofol per Anesthesia Complications:         No immediate complications. Procedure:             Pre-Anesthesia Assessment:                        - The risks and benefits of the procedure and the                         sedation options and risks were discussed with the                         patient. All questions were answered and informed                         consent was obtained.                        - Patient identification and proposed procedure were                         verified prior to the procedure by the nurse. The                         procedure was verified in the procedure room.                        - ASA Grade Assessment: III - A patient with severe                         systemic disease.                        - After reviewing the risks and benefits, the patient                         was deemed in satisfactory condition to undergo the                         procedure.                        After obtaining informed consent, the colonoscope was  passed under direct vision. Throughout the procedure,                         the patient's blood pressure, pulse, and oxygen                         saturations were monitored continuously. The                         Colonoscope was introduced through the anus and                          advanced to the the terminal ileum, with                         identification of the appendiceal orifice and IC                         valve. The colonoscopy was performed without                         difficulty. The patient tolerated the procedure well.                         The quality of the bowel preparation was good. The                         ileocecal valve, appendiceal orifice, and rectum were                         photographed. Findings:      The perianal and digital rectal examinations were normal. Pertinent       negatives include normal sphincter tone and no palpable rectal lesions.      Non-bleeding internal hemorrhoids were found during retroflexion. The       hemorrhoids were Grade I (internal hemorrhoids that do not prolapse).      A few small-mouthed diverticula were found in the sigmoid colon.      There is no endoscopic evidence of inflammation, mass or polyps in the       entire colon.      The terminal ileum appeared normal.      The exam was otherwise without abnormality. Impression:            - Non-bleeding internal hemorrhoids.                        - Diverticulosis in the sigmoid colon.                        - The examined portion of the ileum was normal.                        - The examination was otherwise normal.                        - No specimens collected. Recommendation:        - Patient has a contact number available for  emergencies. The signs and symptoms of potential                         delayed complications were discussed with the patient.                         Return to normal activities tomorrow. Written                         discharge instructions were provided to the patient.                        - Resume previous diet.                        - Resume Plavix (clopidogrel) at prior dose today.                         Refer to managing physician for further adjustment of                          therapy.                        - Repeat colonoscopy in 10 years for screening                         purposes.                        - Return to GI office PRN.                        - The findings and recommendations were discussed with                         the patient. Procedure Code(s):     --- Professional ---                        X1062, Colorectal cancer screening; colonoscopy on                         individual at high risk Diagnosis Code(s):     --- Professional ---                        K57.30, Diverticulosis of large intestine without                         perforation or abscess without bleeding                        K64.0, First degree hemorrhoids                        Z86.010, Personal history of colonic polyps CPT copyright 2019 American Medical Association. All rights reserved. The codes documented in this report are preliminary and upon coder review may  be revised to meet current compliance requirements. Efrain Sella MD, MD 07/24/2021 8:47:23 AM This report has been signed electronically. Number of Addenda: 0 Note Initiated On: 07/24/2021 7:31 AM  Scope Withdrawal Time: 0 hours 6 minutes 31 seconds  Total Procedure Duration: 0 hours 10 minutes 13 seconds  Estimated Blood Loss:  Estimated blood loss: none. Estimated blood loss: none.      [facility]

## 2021-07-24 NOTE — Anesthesia Procedure Notes (Signed)
Date/Time: 07/24/2021 8:35 AM Performed by: Vaughan Sine Pre-anesthesia Checklist: Patient identified, Emergency Drugs available, Suction available, Patient being monitored and Timeout performed Patient Re-evaluated:Patient Re-evaluated prior to induction Oxygen Delivery Method: Simple face mask Preoxygenation: Pre-oxygenation with 100% oxygen Induction Type: IV induction Placement Confirmation: positive ETCO2 and CO2 detector

## 2021-07-24 NOTE — Interval H&P Note (Signed)
History and Physical Interval Note:  07/24/2021 8:28 AM  Justin Mckinney  has presented today for surgery, with the diagnosis of Z86.010  Hx of adenomatous colonic polyps.  The various methods of treatment have been discussed with the patient and family. After consideration of risks, benefits and other options for treatment, the patient has consented to  Procedure(s) with comments: COLONOSCOPY WITH PROPOFOL (N/A) - DM as a surgical intervention.  The patient's history has been reviewed, patient examined, no change in status, stable for surgery.  I have reviewed the patient's chart and labs.  Questions were answered to the patient's satisfaction.     [place], [place]

## 2021-07-25 ENCOUNTER — Encounter: Payer: Self-pay | Admitting: Internal Medicine

## 2022-04-29 NOTE — Progress Notes (Unsigned)
No chief complaint on file.  History of Present Illness: 66 yo male with history of CAD with MI March 2014, hyperlipidemia, DM and sleep apnea who is here today for cardiac follow up. He was in [place], [place] in March 2-14 when he had an anterior wall MI. Emergent cardiac cath with 99% mid LAD stenosis, Diagonal 2 with 50% ostial stenosis, 50% mid Circumflex stenosis, 1st OM ostial 40% stenosis, 50% mid RCA stenosis. Three overlapping drug eluting stents were placed in the mid LAD (2.75 x 32 mm Promus Premier, 2.5 x 24 mm Promus Premier, 2.25 x 12 mm Promus Premier). LVEF 35% by echo immediately following his MI but LV systolic function was normal by echo in July 2014. He was admitted to [facility] January 2015 with sharp chest pain. Cath per Dr. Ellyn Hack February 2015 with stable disease. Patent stents LAD. Echo January 2019 with LVEF=45-50%. Exercise stress test June 2022 with no ischemia.   He is here today for follow up. The patient denies any chest pain, dyspnea, palpitations, lower extremity edema, orthopnea, PND, dizziness, near syncope or syncope.   Primary Care Physician: Juluis Pitch, MD  Past Medical History:  Diagnosis Date   CAD (coronary artery disease)    Anterior STEMI 10/21/12, treated with PCI of LAD, 3 overlapping DES mid LAD   DM (diabetes mellitus) ([place])    Diagnosed 2012   Hyperlipidemia    Hypertension    Myocardial infarction ([place])    Obstructive sleep apnea     Past Surgical History:  Procedure Laterality Date   CARDIAC CATHETERIZATION     COLONOSCOPY WITH PROPOFOL N/A 07/24/2015   Procedure: COLONOSCOPY WITH PROPOFOL;  Surgeon: Lollie Sails, MD;  [facility] ENDOSCOPY;  Service: Endoscopy;  Laterality: N/A;   COLONOSCOPY WITH PROPOFOL N/A 07/24/2021   Procedure: COLONOSCOPY WITH PROPOFOL;  Surgeon: [place], Benay Pike, MD;  Location: [facility] ENDOSCOPY;  Service: Gastroenterology;  Laterality: N/A;  DM   CORONARY ARTERY BYPASS GRAFT     ROTATOR CUFF REPAIR   2004   TONSILLECTOMY      Current Outpatient Medications  Medication Sig Dispense Refill   atorvastatin (LIPITOR) 20 MG tablet TAKE ONE TABLET DAILY     carvedilol (COREG) 3.125 MG tablet Take 1 tablet (3.125 mg total) by mouth 2 (two) times daily. 180 tablet 3   clopidogrel (PLAVIX) 75 MG tablet Take 1 tablet (75 mg total) by mouth daily. 90 tablet 2   Dulaglutide (TRULICITY) 4.17 EY/8.1KG SOPN INJECT 0.5ML (=0.75 MG)    SUBCUTANEOUSLY ONCE WEEKLY     fluticasone (FLONASE) 50 MCG/ACT nasal spray USE 2 SPRAYS IN EACH NOSTRIL ONCE DAILY AS NEEDED FOR RHINITIS OR ALLERGIES     JANUMET XR 50-1000 MG TB24 1 tab twice a day     lisinopril (PRINIVIL,ZESTRIL) 2.5 MG tablet TAKE ONE TABLET TWICE DAILY     Multiple Vitamins-Minerals (CENTRUM SILVER ADULT 50+ PO) TAKE ONE TABLET DAILY     nitroGLYCERIN (NITROSTAT) 0.4 MG SL tablet Place 1 tablet (0.4 mg total) under the tongue every 5 (five) minutes as needed for chest pain. 75 tablet 2   Omega-3 Fatty Acids (OMEGA-3 FISH OIL PO) Take by mouth. TAKE ONE CAPSULE DAILY     sildenafil (REVATIO) 20 MG tablet TAKE 1-5 TABS 30-60 MIN BEFORE ACTIVITY     No current facility-administered medications for this visit.    No Known Allergies  Social History   Socioeconomic History   Marital status: Married    Spouse  name: Not on file   Number of children: 2   Years of education: Not on file   Highest education level: Not on file  Occupational History   Occupation: Engineer, mining    Employer: [employer]  Tobacco Use   Smoking status: Former   Smokeless tobacco: Never  Scientific laboratory technician Use: Never used  Substance and Sexual Activity   Alcohol use: Yes    Comment: occasionally   Drug use: No   Sexual activity: Yes  Other Topics Concern   Not on file  Social History Narrative   Not on file   Social Determinants of Health   Financial Resource Strain: Not on file  Food Insecurity: Not on file  Transportation Needs: Not on file  Physical  Activity: Not on file  Stress: Not on file  Social Connections: Not on file  Intimate Partner Violence: Not on file    Family History  Problem Relation Age of Onset   Heart attack Father    Stroke Mother    Stroke Brother     Review of Systems:  As stated in the HPI and otherwise negative.   There were no vitals taken for this visit.  Physical Examination: General: Well developed, well nourished, NAD  HEENT: OP clear, mucus membranes moist  SKIN: warm, dry. No rashes. Neuro: No focal deficits  Musculoskeletal: Muscle strength 5/5 all ext  Psychiatric: Mood and affect normal  Neck: No JVD, no carotid bruits, no thyromegaly, no lymphadenopathy.  Lungs:Clear bilaterally, no wheezes, rhonci, crackles Cardiovascular: Regular rate and rhythm. No murmurs, gallops or rubs. Abdomen:Soft. Bowel sounds present. Non-tender.  Extremities: No lower extremity edema. Pulses are 2 + in the bilateral DP/PT.  Echo 08/18/17: - Left ventricle: The cavity size was normal. Wall thickness was   normal. Systolic function was mildly reduced. The estimated   ejection fraction was in the range of 45% to 50%. There is   hypokinesis of the anterior myocardium. Doppler parameters are   consistent with abnormal left ventricular relaxation (grade 1   diastolic dysfunction).  EKG:  EKG is *** ordered today. The ekg ordered today demonstrates   Recent Labs: No results found for requested labs within last 365 days.    Lipid Panel: Followed in primary care   Wt Readings from Last 3 Encounters:  07/24/21 215 lb 6.9 oz (97.7 kg)  01/21/21 224 lb 3.2 oz (101.7 kg)  09/08/19 232 lb 1.9 oz (105.3 kg)     Other studies Reviewed: Additional studies/ records that were reviewed today include: . Review of the above records demonstrates:   Assessment and Plan:   1. CAD without angina: He has 3 stents in his LAD and is known to have moderate disease in his RCA and Circumflex. Last cath February 2015 at [facility]  with stable CAD. Exercise stress test in 2022 with no ischemia. No chest pain. Will continue beta blocker, Plavix and statin.    2. Ischemic cardiomyopathy: Echo January 2019 with LVEF 45-50%. Continue beta blocker and Ace-inh.   3. HLD: His lipids are followed in primary care. LDL ***. Continue statin.       Current medicines are reviewed at length with the patient today.  The patient does not have concerns regarding medicines.  The following changes have been made:  no change  Labs/ tests ordered today include:   No orders of the defined types were placed in this encounter.    Disposition:   F/U with me in 12  months  Signed, Lauree Chandler, MD 04/29/2022 8:38 AM    [address], [place], [place]  [address] Phone: [phone]; Fax: [phone]

## 2022-04-30 ENCOUNTER — Encounter: Payer: Self-pay | Admitting: Cardiovascular Disease

## 2022-04-30 ENCOUNTER — Ambulatory Visit: Payer: Medicare Other | Attending: Cardiovascular Disease | Admitting: Cardiovascular Disease

## 2022-04-30 VITALS — BP 124/80 | HR 56 | Ht 70.0 in | Wt 220.2 lb

## 2022-04-30 DIAGNOSIS — I255 Ischemic cardiomyopathy: Secondary | ICD-10-CM | POA: Insufficient documentation

## 2022-04-30 DIAGNOSIS — E7800 Pure hypercholesterolemia, unspecified: Secondary | ICD-10-CM | POA: Diagnosis present

## 2022-04-30 DIAGNOSIS — I2510 Atherosclerotic heart disease of native coronary artery without angina pectoris: Secondary | ICD-10-CM | POA: Insufficient documentation

## 2022-04-30 NOTE — Patient Instructions (Signed)
Medication Instructions:  No changes *If you need a refill on your cardiac medications before your next appointment, please call your pharmacy*   Lab Work: none If you have labs (blood work) drawn today and your tests are completely normal, you will receive your results only by: MyChart Message (if you have MyChart) OR A paper copy in the mail If you have any lab test that is abnormal or we need to change your treatment, we will call you to review the results.   Testing/Procedures: none   Follow-Up: At [facility], you and your health needs are our priority.  As part of our continuing mission to provide you with exceptional heart care, we have created designated Provider Care Teams.  These Care Teams include your primary Cardiologist (physician) and Advanced Practice Providers (APPs -  Physician Assistants and Nurse Practitioners) who all work together to provide you with the care you need, when you need it.  We recommend signing up for the patient portal called "MyChart".  Sign up information is provided on this After Visit Summary.  MyChart is used to connect with patients for Virtual Visits (Telemedicine).  Patients are able to view lab/test results, encounter notes, upcoming appointments, etc.  Non-urgent messages can be sent to your provider as well.   To learn more about what you can do with MyChart, go to [email or web address].    Your next appointment:   12 month(s)  The format for your next appointment:   In Person  Provider:   Christopher McAlhany, MD      Important Information About Sugar       

## 2022-08-15 ENCOUNTER — Ambulatory Visit: Payer: Medicare Other | Admitting: Cardiovascular Disease

## 2023-01-19 ENCOUNTER — Other Ambulatory Visit: Payer: Self-pay

## 2023-01-19 DIAGNOSIS — M25511 Pain in right shoulder: Secondary | ICD-10-CM

## 2023-01-28 ENCOUNTER — Other Ambulatory Visit: Payer: Self-pay | Admitting: Orthopedic Surgery

## 2023-01-28 DIAGNOSIS — M25511 Pain in right shoulder: Secondary | ICD-10-CM

## 2023-02-02 ENCOUNTER — Ambulatory Visit
Admission: RE | Admit: 2023-02-02 | Discharge: 2023-02-02 | Disposition: A | Discharge status: Home / Self Care | Payer: Medicare Other | Source: Ambulatory Visit | Attending: Orthopedic Surgery | Admitting: Orthopedic Surgery

## 2023-02-02 DIAGNOSIS — M25511 Pain in right shoulder: Secondary | ICD-10-CM | POA: Diagnosis present

## 2023-02-09 ENCOUNTER — Other Ambulatory Visit: Payer: Self-pay | Admitting: Orthopedic Surgery

## 2023-02-10 ENCOUNTER — Telehealth (INDEPENDENT_AMBULATORY_CARE_PROVIDER_SITE_OTHER): Payer: Medicare Other

## 2023-02-10 ENCOUNTER — Telehealth: Payer: Self-pay | Admitting: *Deleted

## 2023-02-10 ENCOUNTER — Encounter: Payer: Self-pay | Admitting: Orthopedic Surgery

## 2023-02-10 DIAGNOSIS — Z01810 Encounter for preprocedural cardiovascular examination: Secondary | ICD-10-CM | POA: Diagnosis not present

## 2023-02-10 NOTE — Telephone Encounter (Signed)
Our office just received a clearance request last minute for the pt. Pt tells me that the surgeon office called him Monday and told him they could do his surgery sooner. They then sent the request to our office. We will always do the very best to care for our pt's. Schedules have been very full. Ok per pre op APP today to over book the pre op schedule at 3:40. Pre op APP to first confirm if pt has been holding Plavix. Pt tells me his last dose of Plavix was Sunday and he has not taken anymore since then. Pt has been added to the pre op schedule today 3:40. Med rec and consent are done.

## 2023-02-10 NOTE — Anesthesia Preprocedure Evaluation (Addendum)
Anesthesia Evaluation  Patient identified by MRN, date of birth, ID band Patient awake    Reviewed: Allergy & Precautions, NPO status , Patient's Chart, lab work & pertinent test results  History of Anesthesia Complications (+) PONV and history of anesthetic complications  Airway Mallampati: III  TM Distance: <3 FB Neck ROM: full    Dental  (+) Chipped, Poor Dentition   Pulmonary sleep apnea , former smoker   Pulmonary exam normal        Cardiovascular hypertension, + CAD, + Past MI and + Cardiac Stents  Normal cardiovascular exam  PMH:  Ischemic cardiomyopathy. Acquired from the patient  and from the patient&'s chart.  Coronary artery disease.  PMH:  Myocardial infarction.  Risk factors:  Diabetes mellitus.  Dyslipidemia.   Echo 08-18-16   EF 45% to 50%.  hypokinesis of the anterior myocardium. grade 1    diastolic dysfunction)   04-30-22 67 yo male with history of CAD with AMI March 2014, hyperlipidemia, DM and sleep apnea who is here today for cardiac follow up. March 2014 had an anterior wall MI.   Emergent cardiac cath with 99% mid LAD stenosis, Diagonal 2 with 50% ostial stenosis, 50% mid Circumflex stenosis, 1st OM ostial 40% stenosis, 50% mid RCA stenosis. Three overlapping drug eluting stents were placed in the mid LAD (2.75 x 32 mm Promus Premier, 2.5 x 24 mm Promus Premier, 2.25 x 12 mm Promus Premier). LVEF 35% by echo immediately following his MI but LV systolic function was normal by echo in July 2014.   admitted to [facility] January 2015 with sharp chest pain. Cath per Dr. Herbie Baltimore February 2015 with stable disease. Patent stents LAD.   Echo January 2019 with LVEF=45-50%. Exercise stress test June 2022 with no ischemia.    Neuro/Psych negative neurological ROS  negative psych ROS   GI/Hepatic negative GI ROS, Neg liver ROS,,,  Endo/Other  diabetes, Type 2    Renal/GU      Musculoskeletal   Abdominal    Peds  Hematology negative hematology ROS (+)   Anesthesia Other Findings Patient has cardiac clearance for this procedure.   CAD (coronary artery disease)  DM (diabetes mellitus) (HCC) Hyperlipidemia  Obstructive sleep apnea Myocardial infarction  Hypertension PONV (postoperative nausea and vomiting)  Grade I diastolic dysfunction    Reproductive/Obstetrics negative OB ROS                             Anesthesia Physical Anesthesia Plan  ASA: 3  Anesthesia Plan: General ETT   Post-op Pain Management: Regional block   Induction: Intravenous  PONV Risk Score and Plan: Ondansetron, Dexamethasone, Midazolam and Treatment may vary due to age or medical condition  Airway Management Planned: Oral ETT  Additional Equipment:   Intra-op Plan:   Post-operative Plan: Extubation in OR  Informed Consent: I have reviewed the patients History and Physical, chart, labs and discussed the procedure including the risks, benefits and alternatives for the proposed anesthesia with the patient or authorized representative who has indicated his/her understanding and acceptance.     Dental Advisory Given  Plan Discussed with: Anesthesiologist, CRNA and Surgeon  Anesthesia Plan Comments: (Patient consented for risks of anesthesia including but not limited to:  - adverse reactions to medications - damage to eyes, teeth, lips or other oral mucosa - nerve damage due to positioning  - sore throat or hoarseness - Damage to heart, brain, nerves, lungs, other parts of  body or loss of life  Patient voiced understanding.)       Anesthesia Quick Evaluation

## 2023-02-10 NOTE — Telephone Encounter (Signed)
Our office just received a clearance request last minute for the pt. Pt tells me that the surgeon office called him Monday and told him they could do his surgery sooner. They then sent the request to our office. We will always do the very best to care for our pt's. Schedules have been very full. Ok per pre op APP today to over book the pre op schedule at 3:40. Pre op APP to first confirm if pt has been holding Plavix. Pt tells me his last dose of Plavix was Sunday and he has not taken anymore since then. Pt has been added to the pre op schedule today 3:40. Med rec and consent are done.      Patient Consent for Virtual Visit        LINDSEY HOMMEL has provided verbal consent on 02/10/2023 for a virtual visit (video or telephone).   CONSENT FOR VIRTUAL VISIT FOR:  Selinda Flavin  By participating in this virtual visit I agree to the following:  I hereby voluntarily request, consent and authorize [facility] and its employed or contracted physicians, physician assistants, nurse practitioners or other licensed health care professionals (the Practitioner), to provide me with telemedicine health care services (the "Services") as deemed necessary by the treating Practitioner. I acknowledge and consent to receive the Services by the Practitioner via telemedicine. I understand that the telemedicine visit will involve communicating with the Practitioner through live audiovisual communication technology and the disclosure of certain medical information by electronic transmission. I acknowledge that I have been given the opportunity to request an in-person assessment or other available alternative prior to the telemedicine visit and am voluntarily participating in the telemedicine visit.  I understand that I have the right to withhold or withdraw my consent to the use of telemedicine in the course of my care at any time, without affecting my right to future care or treatment, and that the Practitioner  or I may terminate the telemedicine visit at any time. I understand that I have the right to inspect all information obtained and/or recorded in the course of the telemedicine visit and may receive copies of available information for a reasonable fee.  I understand that some of the potential risks of receiving the Services via telemedicine include:  Delay or interruption in medical evaluation due to technological equipment failure or disruption; Information transmitted may not be sufficient (e.g. poor resolution of images) to allow for appropriate medical decision making by the Practitioner; and/or  In rare instances, security protocols could fail, causing a breach of personal health information.  Furthermore, I acknowledge that it is my responsibility to provide information about my medical history, conditions and care that is complete and accurate to the best of my ability. I acknowledge that Practitioner's advice, recommendations, and/or decision may be based on factors not within their control, such as incomplete or inaccurate data provided by me or distortions of diagnostic images or specimens that may result from electronic transmissions. I understand that the practice of medicine is not an exact science and that Practitioner makes no warranties or guarantees regarding treatment outcomes. I acknowledge that a copy of this consent can be made available to me via my patient portal [facility] MyChart), or I can request a printed copy by calling the office of [facility].    I understand that my insurance will be billed for this visit.   I have read or had this consent read to me. I understand the contents  of this consent, which adequately explains the benefits and risks of the Services being provided via telemedicine.  I have been provided ample opportunity to ask questions regarding this consent and the Services and have had my questions answered to my satisfaction. I give my informed  consent for the services to be provided through the use of telemedicine in my medical care

## 2023-02-10 NOTE — Telephone Encounter (Signed)
   Name: Justin Mckinney  DOB: [birth year]  MRN: [id number]  Primary Cardiologist: Verne Carrow, MD   Preoperative team, please contact this patient and set up a phone call appointment for further preoperative risk assessment. Please obtain consent and complete medication review. Thank you for your help.  I confirm that guidance regarding antiplatelet and oral anticoagulation therapy has been completed and, if necessary, noted below.  Prior to procedure he can hold Plavix for five days, please resume once hemostasis has been achieved.   Patient not seen since 04/30/22 will need phone visit prior to recommendations being given on medical clearance.   Rip Harbour, NP 02/10/2023, 1:38 PM [facility]

## 2023-02-10 NOTE — Telephone Encounter (Signed)
   Pre-operative Risk Assessment    Patient Name: Justin Mckinney  DOB: Mar 25, 1956 MRN: [id number]      Request for Surgical Clearance    Procedure:   Right arthroscopic rotator cuff repair,biceps tenodesis,distal clavicle excision,subacromial decompression.  Date of Surgery:  Clearance 02/13/23                                 Surgeon:  Dr. Signa Kell Surgeon's Group or Practice Name:  [facility] Phone number:  [phone] Fax number:  [phone]   Type of Clearance Requested:   - Medical  - Pharmacy:  Hold Clopidogrel (Plavix) Not Indicated.   Type of Anesthesia:  Not Indicated   Additional requests/questions:    Signed, Emmit Pomfret   02/10/2023, 9:54 AM

## 2023-02-10 NOTE — Progress Notes (Signed)
Virtual Visit via Telephone Note   Because of [name] co-morbid illnesses, he is at least at moderate risk for complications without adequate follow up.  This format is felt to be most appropriate for this patient at this time.  The patient did not have access to video technology/had technical difficulties with video requiring transitioning to audio format only (telephone).  All issues noted in this document were discussed and addressed.  No physical exam could be performed with this format.  Please refer to the patient's chart for his consent to telehealth for [facility].  Evaluation Performed:  Preoperative cardiovascular risk assessment _____________   Date:  02/10/2023   Patient ID:  Justin Mckinney, DOB 1956-07-23, MRN [id number] Patient Location:  Home Provider location:   Office  Primary Care Provider:  Dorothey Baseman, MD Primary Cardiologist:  Verne Carrow, MD  Chief Complaint / Patient Profile   67 y.o. y/o male with a h/o coronary artery disease, ischemic cardiomyopathy, hyperlipidemia who is pending  Right arthroscopic rotator cuff repair,biceps tenodesis,distal clavicle excision,subacromial decompression  and presents today for telephonic preoperative cardiovascular risk assessment.  History of Present Illness    Justin Mckinney is a 67 y.o. male who presents via audio/video conferencing for a telehealth visit today.  Pt was last seen in cardiology clinic on 04/30/2022 by Dr. Clifton James.  At that time Justin Mckinney was doing well .  The patient is now pending procedure as outlined above. Since his last visit, he continues to be stable from a cardiac standpoint.  Today he denies chest pain, shortness of breath, lower extremity edema, fatigue, palpitations, melena, hematuria, hemoptysis, diaphoresis, weakness, presyncope, syncope, orthopnea, and PND.   Past Medical History    Past Medical History:  Diagnosis Date   CAD (coronary artery disease)     Anterior STEMI 10/21/12, treated with PCI of LAD, 3 overlapping DES mid LAD   DM (diabetes mellitus) (HCC)    Diagnosed 2012   Grade I diastolic dysfunction    History of ischemic cardiomyopathy    Hyperlipidemia    Hypertension    Myocardial infarction (HCC) 10/21/2012   Obstructive sleep apnea    PONV (postoperative nausea and vomiting)    Past Surgical History:  Procedure Laterality Date   CARDIAC CATHETERIZATION     09/2012 -3 stents   COLONOSCOPY WITH PROPOFOL N/A 07/24/2015   Procedure: COLONOSCOPY WITH PROPOFOL;  Surgeon: Christena Deem, MD;  Location: [facility] ENDOSCOPY;  Service: Endoscopy;  Laterality: N/A;   COLONOSCOPY WITH PROPOFOL N/A 07/24/2021   Procedure: COLONOSCOPY WITH PROPOFOL;  Surgeon: Toledo, Boykin Nearing, MD;  Location: [facility] ENDOSCOPY;  Service: Gastroenterology;  Laterality: N/A;  DM   ROTATOR CUFF REPAIR  07/28/2002   TONSILLECTOMY     TRIGGER FINGER RELEASE Right 03/01/2004   long finger.  Dr Ernest Pine    Allergies  No Known Allergies  Home Medications    Prior to Admission medications   Medication Sig Start Date End Date Taking? Authorizing Provider  atorvastatin (LIPITOR) 20 MG tablet TAKE ONE TABLET DAILY 10/25/12   [provider]  carvedilol (COREG) 3.125 MG tablet Take 1 tablet (3.125 mg total) by mouth 2 (two) times daily. 09/09/13   Kathleene Hazel, MD  clopidogrel (PLAVIX) 75 MG tablet Take 1 tablet (75 mg total) by mouth daily. 07/09/16   Kathleene Hazel, MD  Dulaglutide (TRULICITY) 0.75 MG/0.5ML SOPN INJECT 0.5ML (=0.75 MG)    SUBCUTANEOUSLY ONCE WEEKLY 11/26/20   [provider]  fluticasone (FLONASE) 50 MCG/ACT nasal spray USE 2 SPRAYS IN EACH NOSTRIL ONCE DAILY AS NEEDED FOR RHINITIS OR ALLERGIES 09/10/18   [provider]  glipiZIDE (GLUCOTROL XL) 10 MG 24 hr tablet Take 1 tablet by mouth daily. 02/12/22 02/12/23  [provider]  lisinopril (PRINIVIL,ZESTRIL) 2.5 MG tablet TAKE ONE TABLET TWICE  DAILY 10/25/12   [provider]  metFORMIN (GLUCOPHAGE-XR) 500 MG 24 hr tablet Take 1,000 mg by mouth 2 (two) times daily with a meal.    [provider]  Multiple Vitamins-Minerals (CENTRUM SILVER ADULT 50+ PO) TAKE ONE TABLET DAILY    [provider]  nitroGLYCERIN (NITROSTAT) 0.4 MG SL tablet Place 1 tablet (0.4 mg total) under the tongue every 5 (five) minutes as needed for chest pain. 09/15/18   Kathleene Hazel, MD  Omega-3 Fatty Acids (OMEGA-3 FISH OIL PO) Take by mouth. TAKE ONE CAPSULE DAILY    [provider]  sildenafil (REVATIO) 20 MG tablet TAKE 1-5 TABS 30-60 MIN BEFORE ACTIVITY 08/10/19   [provider]    Physical Exam    Vital Signs:  Justin Mckinney does not have vital signs available for review today.  Given telephonic nature of communication, physical exam is limited. AAOx3. NAD. Normal affect.  Speech and respirations are unlabored.  Accessory Clinical Findings    None  Assessment & Plan    1.  Preoperative Cardiovascular Risk Assessment:  Right arthroscopic rotator cuff repair,biceps tenodesis,distal clavicle excision,subacromial decompression , [facility], fax number [phone]      Primary Cardiologist: Verne Carrow, MD  Chart reviewed as part of pre-operative protocol coverage. Given past medical history and time since last visit, based on ACC/AHA guidelines, Justin Mckinney would be at acceptable risk for the planned procedure without further cardiovascular testing.   His RCRI is a class III risk, 6.6% risk of major cardiac event.  He is able to complete greater than 4 METS of physical activity.  Patient was advised that if he develops new symptoms prior to surgery to contact our office to arrange a follow-up appointment.  He verbalized understanding.   Prior to procedure he can hold Plavix for five days, please resume once hemostasis has been achieved.     Time:   Today, I have spent 5 minutes with the patient with telehealth technology discussing medical history, symptoms, and management plan. Prior to his phone  evaluation I spent greater than 10 minutes reviewing his past medical history and cardiac medications.   Ronney Asters, NP  02/10/2023, 2:17 PM

## 2023-02-12 NOTE — Discharge Instructions (Addendum)
Post-Op Instructions - Rotator Cuff Repair  1. Bracing: You will wear a shoulder immobilizer or sling for 6 weeks.   2. Driving: No driving for 3 weeks post-op. When driving, do not wear the immobilizer. Ideally, we recommend no driving for 6 weeks while sling is in place as one arm will be immobilized.   3. Activity: No active lifting for 2 months. Wrist, hand, and elbow motion only. Avoid lifting the upper arm away from the body except for hygiene. You are permitted to bend and straighten the elbow passively only (no active elbow motion). You may use your hand and wrist for typing, writing, and managing utensils (cutting food). Do not lift more than a coffee cup for 8 weeks.  When sleeping or resting, inclined positions (recliner chair or wedge pillow) and a pillow under the forearm for support may provide better comfort for up to 4 weeks.  Avoid long distance travel for 4 weeks.  Return to normal activities after rotator cuff repair repair normally takes 6 months on average. If rehab goes very well, may be able to do most activities at 4 months, except overhead or contact sports.  4. Physical Therapy: Begins 3-4 days after surgery, and proceed 1 time per week for the first 6 weeks, then 1-2 times per week from weeks 6-20 post-op.  5. Medications:  - You will be provided a prescription for narcotic pain medicine. After surgery, take 1-2 narcotic tablets every 4 hours if needed for severe pain.  - A prescription for anti-nausea medication will be provided in case the narcotic medicine causes nausea - take 1 tablet every 6 hours only if nauseated.   - Take tylenol 1000 mg (2 Extra Strength tablets or 3 regular strength) every 8 hours for pain.  May decrease or stop tylenol 5 days after surgery if you are having minimal pain. - Take ASA 325mg /day x 2 weeks to help prevent DVTs/PEs (blood clots).  - DO NOT take ANY nonsteroidal anti-inflammatory pain medications (Advil, Motrin, Ibuprofen, Aleve,  Naproxen, or Naprosyn). These medicines can inhibit healing of your shoulder repair.    If you are taking prescription medication for anxiety, depression, insomnia, muscle spasm, chronic pain, or for attention deficit disorder, you are advised that you are at a higher risk of adverse effects with use of narcotics post-op, including narcotic addiction/dependence, depressed breathing, death. If you use non-prescribed substances: alcohol, marijuana, cocaine, heroin, methamphetamines, etc., you are at a higher risk of adverse effects with use of narcotics post-op, including narcotic addiction/dependence, depressed breathing, death. You are advised that taking > 50 morphine milligram equivalents (MME) of narcotic pain medication per day results in twice the risk of overdose or death. For your prescription provided: oxycodone 5 mg - taking more than 6 tablets per day would result in > 50 morphine milligram equivalents (MME) of narcotic pain medication. Be advised that we will prescribe narcotics short-term, for acute post-operative pain only - 3 weeks for major operations such as shoulder repair/reconstruction surgeries.     6. Post-Op Appointment:  Your first post-op appointment will be 10-14 days post-op.  7. Work or School: For most, but not all procedures, we advise staying out of work or school for at least 1 to 2 weeks in order to recover from the stress of surgery and to allow time for healing.   If you need a work or school note this can be provided.   8. Smoking: If you are a smoker, you need to refrain from  smoking in the postoperative period. The nicotine in cigarettes will inhibit healing of your shoulder repair and decrease the chance of successful repair. Similarly, nicotine containing products (gum, patches) should be avoided.   Post-operative Brace: Apply and remove the brace you received as you were instructed to at the time of fitting and as described in detail as the brace's  instructions for use indicate.  Wear the brace for the period of time prescribed by your physician.  The brace can be cleaned with soap and water and allowed to air dry only.  Should the brace result in increased pain, decreased feeling (numbness/tingling), increased swelling or an overall worsening of your medical condition, please contact your doctor immediately.  If an emergency situation occurs as a result of wearing the brace after normal business hours, please dial 911 and seek immediate medical attention.  Let your doctor know if you have any further questions about the brace issued to you. Refer to the shoulder sling instructions for use if you have any questions regarding the correct fit of your shoulder sling.  [facility] Customer Care for Troubleshooting: [phone]  Video that illustrates how to properly use a shoulder sling: "Instructions for Proper Use of an Orthopaedic Sling" http://bass.com/       PERIPHERAL NERVE BLOCK PATIENT INFORMATION  Your surgeon has requested a peripheral nerve block for your surgery. This anesthetic technique provides excellent post-operative pain relief for you in a safe and effective manner. It will also help reduce the risk of nausea and vomiting and allow earlier discharge from the hospital.   The block is performed under sedation with ultrasound guidance prior to your procedure. Due to the sedation, your may or may not remember the block experience. The nerve block will begin to take effect anywhere from 5 to 30 minutes after being administered. You will be transported to the operating room from your surgery after the block is completed.   At the end of surgery, when the anesthesia wears off, you will notice a few things. Your may not be able to move or feel the part of your body targeted by the nerve block. These are normal experiences, and they will disappear as the block wears off.  If you had an interscalene nerve block  performed (which is common for shoulder surgery), your voice can be very hoarse and you may feel that you are not able to take as deep a breath as you did before surgery. Some patients may also notice a droopy eyelid on the affected side. These symptoms will resolve once the block wears off.  Pain control: The nerve block technique used is a single injection that can last anywhere from 1-3 days. The duration of the numbness can vary between individuals. After leaving the hospital, it is important that you begin to take your prescribed pain medication when you start to sense the nerve block wearing off. This will help you avoid unpleasant pain at the time the nerve block wears off, which can sometimes be in the middle of the night. The block will only cover pain in the areas targeted by the nerve block so if you experience surgical pain outside of that area, please take your prescribed pain medication. Management of the "numb area": After a nerve block, you cannot feel pain, pressure, or temperature in the affected area so there is an increased risk for injury. You should take extra care to protect the affected areas until sensation and movement returns. Please take caution to not come  in contact with extremely hot or cold items because you will not be able to sense or protect yourself form the extremes of temperature.  You may experience some persistent numbness after the procedure by most neurological deficits resolve over time and the incidence of serious long term neurological complications attributable to peripheral nerve blocks are relatively uncommon.  POLAR CARE INFORMATION  MassAdvertisement.it  How to use [facility] Polar Care [facility] Therapy System?  YouTube   ShippingScam.co.uk  OPERATING INSTRUCTIONS  Start the product With dry hands, connect the transformer to the electrical connection located on the top of the cooler. Next, plug the transformer into an appropriate  electrical outlet. The unit will automatically start running at this point.  To stop the pump, disconnect electrical power.  Unplug to stop the product when not in use. Unplugging the Polar Care unit turns it off. Always unplug immediately after use. Never leave it plugged in while unattended. Remove pad.    FIRST ADD WATER TO FILL LINE, THEN ICE---Replace ice when existing ice is almost melted  1 Discuss Treatment with your Licensed Health Care Practitioner and Use Only as Prescribed 2 Apply Insulation Barrier & Cold Therapy Pad 3 Check for Moisture 4 Inspect Skin Regularly  Tips and Trouble Shooting Usage Tips 1. Use cubed or chunked ice for optimal performance. 2. It is recommended to drain the Pad between uses. To drain the pad, hold the Pad upright with the hose pointed toward the ground. Depress the black plunger and allow water to drain out. 3. You may disconnect the Pad from the unit without removing the pad from the affected area by depressing the silver tabs on the hose coupling and gently pulling the hoses apart. The Pad and unit will seal itself and will not leak. Note: Some dripping during release is normal. 4. DO NOT RUN PUMP WITHOUT WATER! The pump in this unit is designed to run with water. Running the unit without water will cause permanent damage to the pump. 5. Unplug unit before removing lid.  TROUBLESHOOTING GUIDE Pump not running, Water not flowing to the pad, Pad is not getting cold 1. Make sure the transformer is plugged into the wall outlet. 2. Confirm that the ice and water are filled to the indicated levels. 3. Make sure there are no kinks in the pad. 4. Gently pull on the blue tube to make sure the tube/pad junction is straight. 5. Remove the pad from the treatment site and ll it while the pad is lying at; then reapply. 6. Confirm that the pad couplings are securely attached to the unit. Listen for the double clicks (Figure 1) to confirm the pad couplings are  securely attached.  Leaks    Note: Some condensation on the lines, controller, and pads is unavoidable, especially in warmer climates. 1. If using a [employer] Polar Care Cold Therapy unit with a detachable Cold Therapy Pad, and a leak exists (other than condensation on the lines) disconnect the pad couplings. Make sure the silver tabs on the couplings are depressed before reconnecting the pad to the pump hose; then confirm both sides of the coupling are properly clicked in. 2. If the coupling continues to leak or a leak is detected in the pad itself, stop using it and call [employer] Customer Care at [phone].  Cleaning After use, empty and dry the unit with a soft cloth. Warm water and mild detergent may be used occasionally to clean the pump and tubes.  WARNING: The Polar  Care Cube can be cold enough to cause serious injury, including full skin necrosis. Follow these Operating Instructions, and carefully read the Product Insert (see pouch on side of unit) and the Cold Therapy Pad Fitting Instructions (provided with each Cold Therapy Pad) prior to use.

## 2023-02-20 ENCOUNTER — Ambulatory Visit
Admission: RE | Admit: 2023-02-20 | Discharge: 2023-02-20 | Disposition: A | Discharge status: Home / Self Care | Payer: Medicare Other | Source: Home / Self Care | Attending: Orthopedic Surgery | Admitting: Orthopedic Surgery

## 2023-02-20 ENCOUNTER — Ambulatory Visit: Payer: Medicare Other | Admitting: Anesthesiology

## 2023-02-20 ENCOUNTER — Encounter
Admission: RE | Disposition: A | Discharge status: Home / Self Care | Payer: Self-pay | Source: Home / Self Care | Attending: Orthopedic Surgery

## 2023-02-20 ENCOUNTER — Encounter: Payer: Self-pay | Admitting: Orthopedic Surgery

## 2023-02-20 ENCOUNTER — Other Ambulatory Visit: Payer: Self-pay

## 2023-02-20 DIAGNOSIS — M7521 Bicipital tendinitis, right shoulder: Secondary | ICD-10-CM | POA: Diagnosis not present

## 2023-02-20 DIAGNOSIS — W19XXXA Unspecified fall, initial encounter: Secondary | ICD-10-CM | POA: Insufficient documentation

## 2023-02-20 DIAGNOSIS — S46011A Strain of muscle(s) and tendon(s) of the rotator cuff of right shoulder, initial encounter: Secondary | ICD-10-CM | POA: Insufficient documentation

## 2023-02-20 DIAGNOSIS — Y9351 Activity, roller skating (inline) and skateboarding: Secondary | ICD-10-CM | POA: Diagnosis not present

## 2023-02-20 LAB — GLUCOSE, CAPILLARY: Glucose-Capillary: 303 mg/dL — ABNORMAL HIGH (ref 70–99)

## 2023-02-20 SURGERY — SHOULDER ARTHROSCOPY WITH SUBACROMIAL DECOMPRESSION AND DISTAL CLAVICLE EXCISION
Anesthesia: General | Site: Shoulder | Laterality: Right

## 2023-02-20 MED ORDER — LACTATED RINGERS IR SOLN
Status: DC | PRN
  Administered 2023-02-20 (×3): 3000 mL

## 2023-02-20 MED ORDER — SUCCINYLCHOLINE CHLORIDE 200 MG/10ML IV SOSY
PREFILLED_SYRINGE | INTRAVENOUS | Status: DC | PRN
  Administered 2023-02-20: 100 mg via INTRAVENOUS

## 2023-02-20 MED ORDER — LACTATED RINGERS IV SOLN: INTRAVENOUS | Status: DC

## 2023-02-20 MED ORDER — MIDAZOLAM HCL 2 MG/2ML IJ SOLN
1.0000 mg | INTRAMUSCULAR | Status: DC | PRN
  Administered 2023-02-20: 1 mg via INTRAVENOUS

## 2023-02-20 MED ORDER — ASPIRIN 325 MG PO TBEC: 325.0000 mg | DELAYED_RELEASE_TABLET | Freq: Every day | ORAL | 0 refills | Status: AC

## 2023-02-20 MED ORDER — DEXMEDETOMIDINE HCL IN NACL 200 MCG/50ML IV SOLN
INTRAVENOUS | Status: DC | PRN
  Administered 2023-02-20 (×5): 4 ug via INTRAVENOUS

## 2023-02-20 MED ORDER — DEXAMETHASONE SODIUM PHOSPHATE 4 MG/ML IJ SOLN
INTRAMUSCULAR | Status: DC | PRN
  Administered 2023-02-20: 4 mg via INTRAVENOUS

## 2023-02-20 MED ORDER — INSULIN LISPRO 100 UNIT/ML IJ SOLN
5.0000 [IU] | Freq: Once | INTRAMUSCULAR | Status: AC
  Administered 2023-02-20: 5 [IU] via SUBCUTANEOUS

## 2023-02-20 MED ORDER — OXYCODONE HCL 5 MG PO TABS: 5.0000 mg | ORAL_TABLET | ORAL | 0 refills | Status: AC | PRN

## 2023-02-20 MED ORDER — ONDANSETRON 4 MG PO TBDP: 4.0000 mg | ORAL_TABLET | Freq: Three times a day (TID) | ORAL | 0 refills | Status: AC | PRN

## 2023-02-20 MED ORDER — BUPIVACAINE HCL (PF) 0.5 % IJ SOLN
INTRAMUSCULAR | Status: DC | PRN
  Administered 2023-02-20: 7 mL via PERINEURAL
  Administered 2023-02-20: 3 mL via PERINEURAL

## 2023-02-20 MED ORDER — LIDOCAINE HCL (CARDIAC) PF 100 MG/5ML IV SOSY
PREFILLED_SYRINGE | INTRAVENOUS | Status: DC | PRN
  Administered 2023-02-20: 60 mg via INTRAVENOUS

## 2023-02-20 MED ORDER — LACTATED RINGERS IV SOLN
INTRAVENOUS | Status: DC | PRN
  Administered 2023-02-20: 12000 mL

## 2023-02-20 MED ORDER — ONDANSETRON HCL 4 MG/2ML IJ SOLN
INTRAMUSCULAR | Status: DC | PRN
  Administered 2023-02-20: 4 mg via INTRAVENOUS

## 2023-02-20 MED ORDER — PROPOFOL 10 MG/ML IV BOLUS
INTRAVENOUS | Status: DC | PRN
Start: 2023-02-20 — End: 2023-02-20
  Administered 2023-02-20: 150 mg via INTRAVENOUS

## 2023-02-20 MED ORDER — BUPIVACAINE LIPOSOME 1.3 % IJ SUSP
INTRAMUSCULAR | Status: DC | PRN
  Administered 2023-02-20: 13 mL via PERINEURAL
  Administered 2023-02-20: 7 mL via PERINEURAL

## 2023-02-20 MED ORDER — CEFAZOLIN SODIUM-DEXTROSE 2-4 GM/100ML-% IV SOLN
2.0000 g | INTRAVENOUS | Status: AC
  Administered 2023-02-20: 2 g via INTRAVENOUS

## 2023-02-20 MED ORDER — FENTANYL CITRATE PF 50 MCG/ML IJ SOSY
50.0000 ug | PREFILLED_SYRINGE | INTRAMUSCULAR | Status: DC | PRN
  Administered 2023-02-20: 50 ug via INTRAVENOUS

## 2023-02-20 MED ORDER — ACETAMINOPHEN 500 MG PO TABS: 1000.0000 mg | ORAL_TABLET | Freq: Three times a day (TID) | ORAL | 2 refills | Status: AC

## 2023-02-20 SURGICAL SUPPLY — 61 items
ADH SKN CLS APL DERMABOND .7 (GAUZE/BANDAGES/DRESSINGS)
ADPR IRR PORT MULTIBAG TUBE (MISCELLANEOUS) ×1
ANCH SUT 2 SWLK 19.1 CLS EYLT (Anchor) ×2 IMPLANT
ANCH SUT 2.9 PUSHLOCK ANCH (Orthopedic Implant) ×1 IMPLANT
ANCH SUT 2X2.3 TAPE (Anchor) ×2 IMPLANT
ANCH SUT 5 3.9 CRKSW KNTLS (Anchor) ×2 IMPLANT
ANCHOR 2.3 SP SGL 1.2 XBRAID (Anchor) IMPLANT
ANCHOR 3.9 PEEK CORKSCREW 5MTS (Anchor) IMPLANT
ANCHOR SWIVELOCK BIO 4.75X19.1 (Anchor) IMPLANT
APL PRP STRL LF DISP 70% ISPRP (MISCELLANEOUS) ×1
BLADE SHAVER 4.5X7 STR FR (MISCELLANEOUS) ×1 IMPLANT
BUR BR 5.5 WIDE MOUTH (BURR) ×1 IMPLANT
CANNULA PART THRD DISP 5.75X7 (CANNULA) IMPLANT
CANNULA PARTIAL THREAD 2X7 (CANNULA) IMPLANT
CANNULA TWIST IN 8.25X7CM (CANNULA) IMPLANT
CHLORAPREP W/TINT 26 (MISCELLANEOUS) ×1 IMPLANT
COOLER POLAR GLACIER W/PUMP (MISCELLANEOUS) ×1 IMPLANT
COVER LIGHT HANDLE UNIVERSAL (MISCELLANEOUS) ×2 IMPLANT
DERMABOND ADVANCED .7 DNX12 (GAUZE/BANDAGES/DRESSINGS) IMPLANT
DRAPE INCISE IOBAN 66X45 STRL (DRAPES) ×1 IMPLANT
DRAPE STERI 35X30 U-POUCH (DRAPES) ×1 IMPLANT
DRAPE U-SHAPE 48X52 POLY STRL (PACKS) ×1 IMPLANT
DRSG TEGADERM 4X4.75 (GAUZE/BANDAGES/DRESSINGS) ×3 IMPLANT
ELECT REM PT RETURN 9FT ADLT (ELECTROSURGICAL)
ELECTRODE REM PT RTRN 9FT ADLT (ELECTROSURGICAL) IMPLANT
GAUZE SPONGE 4X4 12PLY STRL (GAUZE/BANDAGES/DRESSINGS) ×1 IMPLANT
GAUZE XEROFORM 1X8 LF (GAUZE/BANDAGES/DRESSINGS) ×1 IMPLANT
GLOVE SRG 8 PF TXTR STRL LF DI (GLOVE) ×3 IMPLANT
GLOVE SURG ENC MOIS LTX SZ7.5 (GLOVE) ×4 IMPLANT
GLOVE SURG UNDER POLY LF SZ8 (GLOVE) ×3
GOWN STRL REIN 2XL XLG LVL4 (GOWN DISPOSABLE) ×1 IMPLANT
GOWN STRL REUS W/ TWL LRG LVL3 (GOWN DISPOSABLE) ×3 IMPLANT
GOWN STRL REUS W/TWL LRG LVL3 (GOWN DISPOSABLE) ×3
IV LACTATED RINGER IRRG 3000ML (IV SOLUTION) ×4
IV LR IRRIG 3000ML ARTHROMATIC (IV SOLUTION) ×4 IMPLANT
KIT CORKSCREW KNTLS 3.9 S/T/P (INSTRUMENTS) IMPLANT
KIT STABILIZATION SHOULDER (MISCELLANEOUS) ×1 IMPLANT
KIT TURNOVER KIT A (KITS) ×1 IMPLANT
MANIFOLD NEPTUNE II (INSTRUMENTS) ×1 IMPLANT
MASK FACE SPIDER DISP (MASK) ×1 IMPLANT
MAT GRAY ABSORB FLUID 28X50 (MISCELLANEOUS) ×2 IMPLANT
PACK ARTHROSCOPY SHOULDER (MISCELLANEOUS) ×1 IMPLANT
PAD ABD DERMACEA PRESS 5X9 (GAUZE/BANDAGES/DRESSINGS) ×2 IMPLANT
PAD WRAPON POLAR SHDR XLG (MISCELLANEOUS) ×1 IMPLANT
PASSER SUT FIRSTPASS SELF (INSTRUMENTS) IMPLANT
SET Y ADAPTER MULIT-BAG IRRIG (MISCELLANEOUS) ×1 IMPLANT
SPONGE T-LAP 18X18 ~~LOC~~+RFID (SPONGE) ×1 IMPLANT
SUT ETHILON 3-0 FS-10 30 BLK (SUTURE) ×1
SUT MNCRL 4-0 (SUTURE)
SUT MNCRL 4-0 27XMFL (SUTURE)
SUT VIC AB 0 CT1 36 (SUTURE) ×1 IMPLANT
SUT VIC AB 2-0 CT2 27 (SUTURE) IMPLANT
SUTURE EHLN 3-0 FS-10 30 BLK (SUTURE) ×1 IMPLANT
SUTURE MNCRL 4-0 27XMF (SUTURE) IMPLANT
SYSTEM IMPL TENODESIS LNT 2.9 (Orthopedic Implant) IMPLANT
TAPE MICROFOAM 4IN (TAPE) ×1 IMPLANT
TUBING CONNECTING 10 (TUBING) ×1 IMPLANT
TUBING INFLOW SET DBFLO PUMP (TUBING) ×1 IMPLANT
TUBING OUTFLOW SET DBLFO PUMP (TUBING) ×1 IMPLANT
WAND WEREWOLF FLOW 90D (MISCELLANEOUS) ×1 IMPLANT
WRAPON POLAR PAD SHDR XLG (MISCELLANEOUS) ×1

## 2023-02-20 NOTE — Transfer of Care (Signed)
Immediate Anesthesia Transfer of Care Note  Patient: Justin Mckinney  Procedure(s) Performed: Right shoulder arthroscopic rotator cuff repair, biceps tenodesis, distal clavicle excision, subacromial decompression (Right: Shoulder)  Patient Location: PACU  Anesthesia Type: General ETT  Level of Consciousness: awake, alert  and patient cooperative  Airway and Oxygen Therapy: Patient Spontanous Breathing and Patient connected to supplemental oxygen  Post-op Assessment: Post-op Vital signs reviewed, Patient's Cardiovascular Status Stable, Respiratory Function Stable, Patent Airway and No signs of Nausea or vomiting  Post-op Vital Signs: Reviewed and stable  Complications: No notable events documented.

## 2023-02-20 NOTE — H&P (Addendum)
Paper H&P to be scanned into permanent record. H&P reviewed. No significant changes noted. No significant change from 01/19/23 office visit.   Heart: regular rate & rhythm Chest: non labored breathing, sounds clear

## 2023-02-20 NOTE — Anesthesia Procedure Notes (Signed)
Procedure Name: Intubation Date/Time: 02/20/2023 9:55 AM  Performed by: Barbette Hair, CRNAPre-anesthesia Checklist: Patient identified, Emergency Drugs available, Suction available, Patient being monitored and Timeout performed Patient Re-evaluated:Patient Re-evaluated prior to induction Oxygen Delivery Method: Circle system utilized Preoxygenation: Pre-oxygenation with 100% oxygen Induction Type: IV induction Ventilation: Mask ventilation without difficulty Laryngoscope Size: Mac and 4 Grade View: Grade II Tube type: Oral Tube size: 7.5 mm Number of attempts: 1 Airway Equipment and Method: Stylet Placement Confirmation: ETT inserted through vocal cords under direct vision, positive ETCO2, CO2 detector and breath sounds checked- equal and bilateral Secured at: 23 cm Tube secured with: Tape Dental Injury: Teeth and Oropharynx as per pre-operative assessment

## 2023-02-20 NOTE — Anesthesia Procedure Notes (Signed)
Anesthesia Regional Block: Interscalene brachial plexus block   Pre-Anesthetic Checklist: , timeout performed,  Correct Patient, Correct Site, Correct Laterality,  Correct Procedure, Correct Position, site marked,  Risks and benefits discussed,  Surgical consent,  Pre-op evaluation,  At surgeon's request and post-op pain management  Laterality: Upper and Right  Prep: chloraprep       Needles:  Injection technique: Single-shot  Needle Type: Stimiplex     Needle Length: 9cm  Needle Gauge: 22     Additional Needles:   Procedures:,,,, ultrasound used (permanent image in chart),,    Narrative:  Start time: 02/20/2023 8:46 AM End time: 02/20/2023 8:48 AM Injection made incrementally with aspirations every 5 mL.  Performed by: Personally   Additional Notes: Patient consented for risk and benefits of nerve block including but not limited to nerve damage, Horner's syndrome, failed block, bleeding and infection.  Patient voiced understanding.  Functioning IV was confirmed and monitors were applied.  Timeout done prior to procedure and prior to any sedation being given to the patient.  Patient confirmed procedure site prior to any sedation given to the patient.  A 50mm 22ga Stimuplex needle was used. Sterile prep,hand hygiene and sterile gloves were used.  Minimal sedation used for procedure.  No paresthesia endorsed by patient during the procedure.  Negative aspiration and negative test dose prior to incremental administration of local anesthetic. The patient tolerated the procedure well with no immediate complications.

## 2023-02-20 NOTE — Op Note (Addendum)
SURGERY DATE: 02/20/2023   PRE-OP DIAGNOSIS:  1. Right biceps tendinopathy 2. Right rotator cuff tear  POST-OP DIAGNOSIS: 1. Right biceps tendinopathy 2. Right rotator cuff tear (full thickness supraspinatus and upper border subscapularis)  PROCEDURES:  1. Right arthroscopic rotator cuff repair (full thickness supraspinatus and upper border subscapularis) 2. Right arthroscopic biceps tenodesis 3. Right arthroscopic extensive debridement of shoulder (glenohumeral and subacromial spaces) 4.  Right arthroscopic loose body removal (prior suture material)  SURGEON: Rosealee Albee, MD   ASSISTANT: Sonny Dandy, PA   ANESTHESIA: Gen with Exparel interscalene block   ESTIMATED BLOOD LOSS: 5cc   DRAINS:  none   TOTAL IV FLUIDS: per anesthesia      SPECIMENS: none   IMPLANTS:  - Arthrex 2.75mm PushLock x 1 - Arthrex 3.84mm Knotless Corkscrew x 1 - Arthrex 4.14mm SwiveLock x 2 - Iconix SPEED double loaded with 1.2 and 2.56mm tape x 2     OPERATIVE FINDINGS:  Examination under anesthesia: A careful examination under anesthesia was performed.  Passive range of motion was: FF: 150; ER at side: 45; ER in abduction: 90; IR in abduction: 45.  Anterior load shift: NT.  Posterior load shift: NT.  Sulcus in neutral: NT.  Sulcus in ER: NT.     Intra-operative findings: A thorough arthroscopic examination of the shoulder was performed.  The findings are: 1. Biceps tendon: tendinopathy with significant erythema and split thickness tearing 2. Superior labrum: erythema with degenerative tearing  3. Posterior labrum and capsule: normal 4. Inferior capsule and inferior recess: normal 5. Glenoid cartilage surface: Normal 6. Supraspinatus attachment: full-thickness tear of the supraspinatus 7. Posterior rotator cuff attachment: normal 8. Humeral head articular cartilage: normal 9. Rotator interval: significant synovitis 10: Subscapularis tendon: Medial displacement of the comma tissue with superior  border of the subscapularis torn from the footprint 11. Anterior labrum: Mildly degenerative 12. IGHL: normal   OPERATIVE REPORT:    Indications for procedure:  CALOGERO BOOSE is a 67 y.o. male with approximately 5 months of right shoulder pain after he felt a tearing sensation while lifting weights at the gym. Had a fall while roller skating.  Of note, he has had a prior open rotator cuff repair by Dr. Ernest Pine in 2004.  Since this most recent fall, he has had difficulty with overhead motion since that time with sensations of weakness. Clinical exam and MRI were suggestive of rotator cuff tea and biceps tendinopathy. After discussion of risks, benefits, and alternatives to surgery, the patient elected to proceed.    Procedure in detail:   I identified Selinda Flavin in the pre-operative holding area.  I marked the operative shoulder with my initials. I reviewed the risks and benefits of the proposed surgical intervention, and the patient wished to proceed.  Anesthesia was then performed with an Exparel interscalene block.  The patient was transferred to the operative suite and placed in the beach chair position.     Appropriate IV antibiotics were administered prior to incision. The operative upper extremity was then prepped and draped in standard fashion. A time out was performed confirming the correct extremity, correct patient, and correct procedure.    I then created a standard posterior portal with an 11 blade. The glenohumeral joint was easily entered with a blunt trocar and the arthroscope introduced. The findings of diagnostic arthroscopy are described above. I debrided degenerative tissue including the severely frayed biceps tendon, synovitic tissue about the rotator interval, and anterior and superior labrum. I  then coagulated the inflamed synovium to obtain hemostasis and reduce the risk of post-operative swelling using an Arthrocare radiofrequency device.   I then turned my attention to  the arthroscopic biceps tenodesis. The Loop n Tack technique was used to pass a FiberTape through the biceps in a locked fashion adjacent to the biceps anchor.  A hole for a 2.9 mm Arthrex PushLock was drilled in the bicipital groove just superior to the subscapularis tendon insertion.  The biceps tendon was then cut and the biceps anchor complex was debrided down to a stable base on the superior labrum.  The FiberTape was loaded onto the PushLock anchor and impacted into place into the previously drilled hole in the bicipital groove.  This appropriately secured the biceps into the bicipital groove and took it off of tension.   Next, arthroscopic repair of the subscapularis was performed.  The upper border of the subscapularis was noted to be torn and the comma tissue was noted to be displaced medially.  The lesser tuberosity footprint was prepared with a combination of electrocautery and an arthroscopic curette.  An Arthrex knotless corkscrew was placed into the lesser tuberosity footprint from the anterior portal.  A BirdBeak was used to shuttle the repair suture through the upper border of the subscapularis tendon.  The suture was then shuttled through the anchor. With the arm in neutral rotation, the repair was tensioned appropriately. This appropriately reduced the subscapularis tear.  The arm was then internally and externally rotated and the subscapularis was noted to move appropriately with rotation.  The remainder of the suture was then cut.  Next, the arthroscope was then introduced into the subacromial space. A direct lateral portal was created with an 11-blade after spinal needle localization. An extensive subacromial bursectomy and debridement was performed using a combination of the shaver and Arthrocare wand. Next, I created an accessory posterolateral portal to assist with visualization and instrumentation.  I debrided the poor quality edges of the supraspinatus tendon.  This was a U-shaped tear  of the supraspinatus.  I prepared the footprint using a burr to expose bleeding bone.  Sutures from prior rotator cuff repair were noted to be loose at the articular margin.   A percutaneous portal was made along the anterolateral aspect of the acromion.  Using this portal, the sutures were removed using an arthroscopic grasper. Largest suture measured approximately 3 cm.     Using the previously made anterolateral portal, I placed 1 Iconix SPEED medial row anchor along the anterior portion of the tear at the articular margin. Another SPEED anchor was placed along the posterior portion of the tear at the articular margin. I then shuttled all 8 strands of tape through the rotator cuff just lateral to the musculotendinous junction using a FirstPass suture passer spanning the anterior to posterior extent of the tear. The posterior strands of each suture were passed through an [employer] anchor.  This was placed approximately 2 cm distal to the lateral edge of the footprint in line with the posterior aspect of the tear with appropriate tensioning of each suture prior to final fixation.  Similarly, the anterior strands of each suture were passed through another SwiveLock anchor along the anterior margin of the tear.  There were 2 small dogears, one anteriorly and 1 centrally.  The knotless mechanism of the SwiveLock anchors were utilized to reduce the dogears.  This construct allowed for excellent reapproximation of the rotator cuff to its native footprint without undue tension.  Appropriate  compression was achieved.  The repair was stable to external and internal rotation.   Fluid was evacuated from the shoulder, and the portals were closed with 3-0 Nylon. Xeroform was applied to the portals. A sterile dressing was applied, followed by a Polar Care sleeve and a SlingShot shoulder immobilizer/sling. The patient was awakened from anesthesia without difficulty and was transferred to the PACU in stable condition.     Of note, assistance from a PA was essential to performing the surgery.  PA was present for the entire surgery.  PA assisted with patient positioning, retraction, instrumentation, and wound closure. The surgery would have been more difficult and had longer operative time without PA assistance.   COMPLICATIONS: none   DISPOSITION: plan for discharge home after recovery in PACU     POSTOPERATIVE PLAN: Remain in sling (except hygiene and elbow/wrist/hand RoM exercises as instructed by PT) x 6 weeks and NWB for this time. PT to begin 3-4 days after surgery.  Large rotator cuff repair rehab protocol.  Resume Plavix on postoperative day 1 with ASA 325mg  daily x 2 weeks for DVT ppx.

## 2023-02-20 NOTE — Anesthesia Postprocedure Evaluation (Signed)
Anesthesia Post Note  Patient: Justin Mckinney  Procedure(s) Performed: Right shoulder arthroscopic rotator cuff repair, biceps tenodesis, distal clavicle excision, subacromial decompression (Right: Shoulder)  Patient location during evaluation: PACU Anesthesia Type: General Level of consciousness: awake and alert Pain management: pain level controlled Vital Signs Assessment: post-procedure vital signs reviewed and stable Respiratory status: spontaneous breathing, nonlabored ventilation, respiratory function stable and patient connected to nasal cannula oxygen Cardiovascular status: blood pressure returned to baseline and stable Postop Assessment: no apparent nausea or vomiting Anesthetic complications: no   No notable events documented.   Last Vitals:  Vitals:   02/20/23 1215 02/20/23 1230  BP: 115/77 104/71  Pulse: (!) 59 61  Resp: 14 18  Temp:    SpO2: 97% 95%    Last Pain:  Vitals:   02/20/23 1230  TempSrc:   PainSc: 0-No pain                 Cleda Mccreedy Tambi Thole
